# Patient Record
Sex: Male | Born: 1950
Health system: Southern US, Community
[De-identification: ages and names within clinical notes are randomized; demographics above are authoritative.]

## PROBLEM LIST (undated history)

## (undated) DIAGNOSIS — T7840XA Allergy, unspecified, initial encounter: Secondary | ICD-10-CM

## (undated) DIAGNOSIS — K259 Gastric ulcer, unspecified as acute or chronic, without hemorrhage or perforation: Secondary | ICD-10-CM

## (undated) DIAGNOSIS — E785 Hyperlipidemia, unspecified: Secondary | ICD-10-CM

## (undated) DIAGNOSIS — H269 Unspecified cataract: Secondary | ICD-10-CM

## (undated) DIAGNOSIS — K219 Gastro-esophageal reflux disease without esophagitis: Secondary | ICD-10-CM

## (undated) HISTORY — PX: HERNIA REPAIR: SHX51

## (undated) HISTORY — PX: CHOLECYSTECTOMY: SHX55

## (undated) HISTORY — DX: Gastro-esophageal reflux disease without esophagitis: K21.9

## (undated) HISTORY — PX: TONSILLECTOMY: SUR1361

## (undated) HISTORY — DX: Hyperlipidemia, unspecified: E78.5

## (undated) HISTORY — PX: UPPER GASTROINTESTINAL ENDOSCOPY: SHX188

## (undated) HISTORY — DX: Gastric ulcer, unspecified as acute or chronic, without hemorrhage or perforation: K25.9

## (undated) HISTORY — DX: Unspecified cataract: H26.9

## (undated) HISTORY — PX: COLONOSCOPY: SHX174

## (undated) HISTORY — DX: Allergy, unspecified, initial encounter: T78.40XA

---

## 2000-01-30 ENCOUNTER — Inpatient Hospital Stay (HOSPITAL_COMMUNITY): Admission: EM | Admit: 2000-01-30 | Discharge: 2000-02-01 | Payer: Self-pay | Admitting: Emergency Medicine

## 2000-01-30 ENCOUNTER — Encounter: Payer: Self-pay | Admitting: Surgery

## 2000-01-30 ENCOUNTER — Encounter: Payer: Self-pay | Admitting: Emergency Medicine

## 2004-03-23 ENCOUNTER — Ambulatory Visit (HOSPITAL_COMMUNITY): Admission: RE | Admit: 2004-03-23 | Discharge: 2004-03-23 | Payer: Self-pay | Admitting: Surgery

## 2005-03-24 ENCOUNTER — Ambulatory Visit: Payer: Self-pay | Admitting: Gastroenterology

## 2005-03-29 ENCOUNTER — Ambulatory Visit: Payer: Self-pay | Admitting: Gastroenterology

## 2013-07-12 ENCOUNTER — Inpatient Hospital Stay (HOSPITAL_COMMUNITY): Admission: RE | Admit: 2013-07-12 | Payer: Self-pay | Source: Ambulatory Visit

## 2013-07-25 ENCOUNTER — Other Ambulatory Visit (HOSPITAL_COMMUNITY): Payer: Self-pay | Admitting: Internal Medicine

## 2013-07-25 ENCOUNTER — Ambulatory Visit (HOSPITAL_COMMUNITY)
Admission: RE | Admit: 2013-07-25 | Discharge: 2013-07-25 | Disposition: A | Discharge status: Home / Self Care | Payer: BC Managed Care – PPO | Source: Ambulatory Visit | Attending: Vascular Surgery | Admitting: Vascular Surgery

## 2013-07-25 DIAGNOSIS — I714 Abdominal aortic aneurysm, without rupture, unspecified: Secondary | ICD-10-CM

## 2013-07-25 DIAGNOSIS — Z1389 Encounter for screening for other disorder: Secondary | ICD-10-CM | POA: Insufficient documentation

## 2013-07-30 ENCOUNTER — Ambulatory Visit (INDEPENDENT_AMBULATORY_CARE_PROVIDER_SITE_OTHER): Payer: BC Managed Care – PPO | Admitting: Cardiovascular Disease

## 2013-07-30 ENCOUNTER — Encounter: Payer: Self-pay | Admitting: Cardiovascular Disease

## 2013-07-30 VITALS — BP 150/80 | HR 70 | Resp 16 | Ht 67.0 in | Wt 187.5 lb

## 2013-07-30 DIAGNOSIS — R079 Chest pain, unspecified: Secondary | ICD-10-CM

## 2013-07-30 DIAGNOSIS — E663 Overweight: Secondary | ICD-10-CM

## 2013-07-30 DIAGNOSIS — R002 Palpitations: Secondary | ICD-10-CM

## 2013-07-30 NOTE — Patient Instructions (Signed)
Dr Sallyanne Kuster recommends you to follow-up as needed.

## 2013-08-05 ENCOUNTER — Encounter: Payer: Self-pay | Admitting: Cardiovascular Disease

## 2013-08-05 DIAGNOSIS — E663 Overweight: Secondary | ICD-10-CM | POA: Insufficient documentation

## 2013-08-05 DIAGNOSIS — R002 Palpitations: Secondary | ICD-10-CM | POA: Insufficient documentation

## 2013-08-05 NOTE — Progress Notes (Signed)
Patient ID: Todd Caldwell, male   DOB: 03/02/1951, 63 y.o.   MRN: [id number]      Reason for office visit Chest pain, palpitations, family history of aneurysms  Todd Caldwell is referred in consultation by Dr. Joylene Draft with complaints of chest discomfort and concerns regarding an abdominal aortic aneurysm. His father was a smoker and died from a ruptured aortic aneurysm at age 64. There is a less precise history of aneurysm in his paternal grandfather who died of a stroke.  Dr. Joylene Draft ordered an abdominal ultrasound which shows no evidence of abdominal aortic aneurysm.  The patient was having problems with some chest pain on the right side that sounds convincingly musculoskeletal and had symptoms of palpitations over the last 6-8 weeks. Interestingly his symptoms resolve completely after he decreased his daily aspirin 2 a weekly dose. He denies shortness of breath or chest pain with activity. He is overweight, borderline obese.  He was diagnos and try taking simvastatin but developed pain in his feet and numbness in his wrists. This resolved after stopping the statin. When he tried to take Crestor, the same symptoms occurred and he stopped this medication as well.ed with hyperlipidemia   Allergies  Allergen Reactions  . Statins     myalgias   . Chocolate Rash    Current Outpatient Prescriptions  Medication Sig Dispense Refill  . Acetaminophen (TYLENOL EXTRA STRENGTH) 167 MG/5ML LIQD 500 mg daily.      Marland Kitchen aspirin 81 MG tablet Take 81 mg by mouth once a week.      . cholecalciferol (VITAMIN D) 1000 UNITS tablet daily.      . Cyanocobalamin (B-12) 1000 MCG CAPS daily.      . Multiple Vitamins-Minerals (PX MENS MULTIVITAMINS) TABS daily.      . Omega 3 1000 MG CAPS 1,000 mg daily.      . ranitidine (ZANTAC) 150 MG capsule Take 150 mg by mouth daily.       No current facility-administered medications for this visit.    No past medical history on file.  No past surgical history on  file.  No family history on file.  History   Social History  . Marital Status: Married    Spouse Name: N/A    Number of Children: N/A  . Years of Education: N/A   Occupational History  . Not on file.   Social History Main Topics  . Smoking status: Never Smoker   . Smokeless tobacco: Not on file  . Alcohol Use: Yes     Comment: occas.  . Drug Use: No  . Sexual Activity: Not on file   Other Topics Concern  . Not on file   Social History Narrative  . No narrative on file    Review of systems: The patient specifically denies any chest pain at rest or with exertion, dyspnea at rest or with exertion, orthopnea, paroxysmal nocturnal dyspnea, syncope, palpitations, focal neurological deficits, intermittent claudication, lower extremity edema, unexplained weight gain, cough, hemoptysis or wheezing.  The patient also denies abdominal pain, nausea, vomiting, dysphagia, diarrhea, constipation, polyuria, polydipsia, dysuria, hematuria, frequency, urgency, abnormal bleeding or bruising, fever, chills, unexpected weight changes, mood swings, change in skin or hair texture, change in voice quality, auditory or visual problems, allergic reactions or rashes, new musculoskeletal complaints other than usual "aches and pains".   PHYSICAL EXAM BP 150/80  Pulse 70  Resp 16  Ht 5\' 7"  (1.702 m)  Wt 85.049 kg (187 lb 8 oz)  BMI  29.36 kg/m2  General: Alert, oriented x3, no distress Head: no evidence of trauma, PERRL, EOMI, no exophtalmos or lid lag, no myxedema, no xanthelasma; normal ears, nose and oropharynx Neck: normal jugular venous pulsations and no hepatojugular reflux; brisk carotid pulses without delay and no carotid bruits Chest: clear to auscultation, no signs of consolidation by percussion or palpation, normal fremitus, symmetrical and full respiratory excursions Cardiovascular: normal position and quality of the apical impulse, regular rhythm, normal first and second heart sounds,  no murmurs, rubs or gallops Abdomen: no tenderness or distention, no masses by palpation, no abnormal pulsatility or arterial bruits, normal bowel sounds, no hepatosplenomegaly Extremities: no clubbing, cyanosis or edema; 2+ radial, ulnar and brachial pulses bilaterally; 2+ right femoral, posterior tibial and dorsalis pedis pulses; 2+ left femoral, posterior tibial and dorsalis pedis pulses; no subclavian or femoral bruits Neurological: grossly nonfocal   EKG: NSR, questionable left atrial abnormality, no repolarization abnormalities  Lipid Panel results not available No results found for this basename: chol, trig, hdl, cholhdl, vldl, ldlcalc    BMET No results found for this basename: na, k, cl, co2, glucose, bun, creatinine, calcium, gfrnonaa, gfraa     ASSESSMENT AND PLAN  At this point Todd Caldwell is essentially asymptomatic at this time. It seems that his major concern was that he might have an abdominal aortic aneurysm which was not found by ultrasonography. We discussed the fact that abdominal aortic aneurysms are not necessarily familial and are strongly related to smoking. He does not want to start taking any medications for his cholesterol, because of the side effects. His blood pressure is borderline elevated today but is usually much better. He tells me was normal when he last Dr. Joylene Draft. At home when he checks it it is usually 120/70 mm Hg. He does not have diabetes and does not smoke. I don't think that he needs to take a daily aspirin.  We did talk about the importance of weight loss and regular physical activity. His current BMI is 29 and he should target weight loss of at least 15-20 pounds gradually over the next year. He will followup as needed or if there any cardiac issues not yet addressed.  Patient Instructions  Dr Sallyanne Kuster recommends you to follow-up as needed.    Orders Placed This Encounter  Procedures  . EKG 12-Lead   Meds ordered this encounter   Medications  . aspirin 81 MG tablet    Sig: Take 81 mg by mouth once a week.  . cholecalciferol (VITAMIN D) 1000 UNITS tablet    Sig: daily.  . ranitidine (ZANTAC) 150 MG capsule    Sig: Take 150 mg by mouth daily.  . Omega 3 1000 MG CAPS    Sig: 1,000 mg daily.  . Cyanocobalamin (B-12) 1000 MCG CAPS    Sig: daily.  . Multiple Vitamins-Minerals (PX MENS MULTIVITAMINS) TABS    Sig: daily.  . Acetaminophen (TYLENOL EXTRA STRENGTH) 167 MG/5ML LIQD    Sig: 500 mg daily.    Holli Humbles, MD, [place] [phone] office [phone] pager

## 2014-04-25 ENCOUNTER — Telehealth: Payer: Self-pay | Admitting: Internal Medicine

## 2014-04-25 NOTE — Telephone Encounter (Signed)
Pt called and would like to speak to Dr.Street concerning Todd Caldwell who is staying at [facility]. He is in agreement with Dr. Venetia Maxon on taking the patient off of her medications. Please call him at [phone]. jw

## 2014-04-29 NOTE — Telephone Encounter (Signed)
Returned phone call. Documented under Todd Caldwell' chart (11/20/1919) as she is the patient in this case, not Mr. Meditz, who is pt's HCPOA.

## 2015-04-07 ENCOUNTER — Encounter: Payer: Self-pay | Admitting: Gastroenterology

## 2015-05-10 DIAGNOSIS — Z23 Encounter for immunization: Secondary | ICD-10-CM | POA: Diagnosis not present

## 2015-08-19 DIAGNOSIS — D510 Vitamin B12 deficiency anemia due to intrinsic factor deficiency: Secondary | ICD-10-CM | POA: Diagnosis not present

## 2015-08-19 DIAGNOSIS — Z125 Encounter for screening for malignant neoplasm of prostate: Secondary | ICD-10-CM | POA: Diagnosis not present

## 2015-08-19 DIAGNOSIS — E784 Other hyperlipidemia: Secondary | ICD-10-CM | POA: Diagnosis not present

## 2015-08-19 DIAGNOSIS — R7301 Impaired fasting glucose: Secondary | ICD-10-CM | POA: Diagnosis not present

## 2015-08-25 DIAGNOSIS — R030 Elevated blood-pressure reading, without diagnosis of hypertension: Secondary | ICD-10-CM | POA: Diagnosis not present

## 2015-08-25 DIAGNOSIS — G5600 Carpal tunnel syndrome, unspecified upper limb: Secondary | ICD-10-CM | POA: Diagnosis not present

## 2015-08-25 DIAGNOSIS — I998 Other disorder of circulatory system: Secondary | ICD-10-CM | POA: Diagnosis not present

## 2015-08-25 DIAGNOSIS — R002 Palpitations: Secondary | ICD-10-CM | POA: Diagnosis not present

## 2015-08-25 DIAGNOSIS — I517 Cardiomegaly: Secondary | ICD-10-CM | POA: Diagnosis not present

## 2015-08-25 DIAGNOSIS — E784 Other hyperlipidemia: Secondary | ICD-10-CM | POA: Diagnosis not present

## 2015-08-25 DIAGNOSIS — Z0000 Encounter for general adult medical examination without abnormal findings: Secondary | ICD-10-CM | POA: Diagnosis not present

## 2015-08-25 DIAGNOSIS — Z6830 Body mass index (BMI) 30.0-30.9, adult: Secondary | ICD-10-CM | POA: Diagnosis not present

## 2015-08-25 DIAGNOSIS — H9193 Unspecified hearing loss, bilateral: Secondary | ICD-10-CM | POA: Diagnosis not present

## 2015-08-25 DIAGNOSIS — Z1389 Encounter for screening for other disorder: Secondary | ICD-10-CM | POA: Diagnosis not present

## 2015-08-25 DIAGNOSIS — D539 Nutritional anemia, unspecified: Secondary | ICD-10-CM | POA: Diagnosis not present

## 2015-08-25 DIAGNOSIS — M79671 Pain in right foot: Secondary | ICD-10-CM | POA: Diagnosis not present

## 2015-10-03 DIAGNOSIS — R05 Cough: Secondary | ICD-10-CM | POA: Diagnosis not present

## 2015-10-03 DIAGNOSIS — J302 Other seasonal allergic rhinitis: Secondary | ICD-10-CM | POA: Diagnosis not present

## 2015-11-14 DIAGNOSIS — J188 Other pneumonia, unspecified organism: Secondary | ICD-10-CM | POA: Diagnosis not present

## 2015-11-14 DIAGNOSIS — E784 Other hyperlipidemia: Secondary | ICD-10-CM | POA: Diagnosis not present

## 2016-04-16 DIAGNOSIS — Z23 Encounter for immunization: Secondary | ICD-10-CM | POA: Diagnosis not present

## 2016-04-29 DIAGNOSIS — I708 Atherosclerosis of other arteries: Secondary | ICD-10-CM | POA: Diagnosis not present

## 2016-04-29 DIAGNOSIS — H2513 Age-related nuclear cataract, bilateral: Secondary | ICD-10-CM | POA: Diagnosis not present

## 2016-04-29 DIAGNOSIS — H16223 Keratoconjunctivitis sicca, not specified as Sjogren's, bilateral: Secondary | ICD-10-CM | POA: Diagnosis not present

## 2016-05-14 DIAGNOSIS — H2511 Age-related nuclear cataract, right eye: Secondary | ICD-10-CM | POA: Diagnosis not present

## 2016-07-05 DIAGNOSIS — H2511 Age-related nuclear cataract, right eye: Secondary | ICD-10-CM | POA: Diagnosis not present

## 2016-07-06 DIAGNOSIS — H2512 Age-related nuclear cataract, left eye: Secondary | ICD-10-CM | POA: Diagnosis not present

## 2016-07-26 DIAGNOSIS — H2512 Age-related nuclear cataract, left eye: Secondary | ICD-10-CM | POA: Diagnosis not present

## 2016-08-23 DIAGNOSIS — D539 Nutritional anemia, unspecified: Secondary | ICD-10-CM | POA: Diagnosis not present

## 2016-08-23 DIAGNOSIS — E784 Other hyperlipidemia: Secondary | ICD-10-CM | POA: Diagnosis not present

## 2016-08-23 DIAGNOSIS — D538 Other specified nutritional anemias: Secondary | ICD-10-CM | POA: Diagnosis not present

## 2016-08-23 DIAGNOSIS — R7301 Impaired fasting glucose: Secondary | ICD-10-CM | POA: Diagnosis not present

## 2016-08-23 DIAGNOSIS — Z125 Encounter for screening for malignant neoplasm of prostate: Secondary | ICD-10-CM | POA: Diagnosis not present

## 2016-08-30 DIAGNOSIS — I517 Cardiomegaly: Secondary | ICD-10-CM | POA: Diagnosis not present

## 2016-08-30 DIAGNOSIS — R0609 Other forms of dyspnea: Secondary | ICD-10-CM | POA: Diagnosis not present

## 2016-08-30 DIAGNOSIS — R030 Elevated blood-pressure reading, without diagnosis of hypertension: Secondary | ICD-10-CM | POA: Diagnosis not present

## 2016-08-30 DIAGNOSIS — H9193 Unspecified hearing loss, bilateral: Secondary | ICD-10-CM | POA: Diagnosis not present

## 2016-08-30 DIAGNOSIS — E784 Other hyperlipidemia: Secondary | ICD-10-CM | POA: Diagnosis not present

## 2016-08-30 DIAGNOSIS — I998 Other disorder of circulatory system: Secondary | ICD-10-CM | POA: Diagnosis not present

## 2016-08-30 DIAGNOSIS — Z1389 Encounter for screening for other disorder: Secondary | ICD-10-CM | POA: Diagnosis not present

## 2016-08-30 DIAGNOSIS — Z6832 Body mass index (BMI) 32.0-32.9, adult: Secondary | ICD-10-CM | POA: Diagnosis not present

## 2016-08-30 DIAGNOSIS — Z0000 Encounter for general adult medical examination without abnormal findings: Secondary | ICD-10-CM | POA: Diagnosis not present

## 2016-08-30 DIAGNOSIS — E663 Overweight: Secondary | ICD-10-CM | POA: Diagnosis not present

## 2016-08-30 DIAGNOSIS — R600 Localized edema: Secondary | ICD-10-CM | POA: Diagnosis not present

## 2016-08-30 DIAGNOSIS — R7301 Impaired fasting glucose: Secondary | ICD-10-CM | POA: Diagnosis not present

## 2016-08-31 ENCOUNTER — Encounter (HOSPITAL_COMMUNITY): Payer: Self-pay

## 2016-09-03 ENCOUNTER — Other Ambulatory Visit (HOSPITAL_COMMUNITY): Payer: Self-pay | Admitting: Internal Medicine

## 2016-09-03 ENCOUNTER — Ambulatory Visit (HOSPITAL_COMMUNITY)
Admission: RE | Admit: 2016-09-03 | Discharge: 2016-09-03 | Disposition: A | Discharge status: Home / Self Care | Payer: Medicare Other | Source: Ambulatory Visit | Attending: Vascular Surgery | Admitting: Vascular Surgery

## 2016-09-03 DIAGNOSIS — Z1212 Encounter for screening for malignant neoplasm of rectum: Secondary | ICD-10-CM | POA: Diagnosis not present

## 2016-09-03 DIAGNOSIS — R609 Edema, unspecified: Secondary | ICD-10-CM

## 2016-09-03 DIAGNOSIS — I872 Venous insufficiency (chronic) (peripheral): Secondary | ICD-10-CM | POA: Insufficient documentation

## 2016-09-07 ENCOUNTER — Encounter: Payer: Self-pay | Admitting: Interventional Cardiology

## 2016-09-26 NOTE — Progress Notes (Signed)
Cardiology Office Note   Date:  09/27/2016   ID:  Todd Caldwell, DOB 29-Sep-1950, MRN [id number]  PCP:  Crist Infante, MD    No chief complaint on file. SHOB, hyperlipidemia   Wt Readings from Last 3 Encounters:  09/27/16 193 lb (87.5 kg)  07/30/13 187 lb 8 oz (85 kg)       History of Present Illness: Todd Caldwell is a 66 y.o. male who is being seen today for the evaluation of shortness of breath at the request of Crist Infante, MD.  He was taking heavy chairs upstairs, and noted some SHOB.  He had to rest a while. He has had high cholesterol. He has been intolerant of statins.  He reports joint swelling and pain in his knees and toes and fingers.  He was intolerant of atorvastatin, crestor and zetia. Knee swelling after the Zetia.    He denies pain , pressure or tightness with exertion.  He states he had a lower extremity Doppler that did not show any blood clot in his leg.    Past Medical History:  Diagnosis Date  . GERD (gastroesophageal reflux disease)     History reviewed. No pertinent surgical history.   Current Outpatient Prescriptions  Medication Sig Dispense Refill  . Acetaminophen (TYLENOL EXTRA STRENGTH) 167 MG/5ML LIQD 500 mg daily.    Marland Kitchen aspirin 81 MG tablet Take 81 mg by mouth once a week.    . cholecalciferol (VITAMIN D) 1000 UNITS tablet daily.    . Cyanocobalamin (B-12) 1000 MCG CAPS daily.    Marland Kitchen losartan (COZAAR) 25 MG tablet Take 25 mg by mouth daily.    . Multiple Vitamins-Minerals (PX MENS MULTIVITAMINS) TABS daily.    . Omega 3 1000 MG CAPS 1,000 mg daily.     No current facility-administered medications for this visit.     Allergies:   Statins and Chocolate    Social History:  The patient  reports that he has never smoked. He has never used smokeless tobacco. He reports that he drinks alcohol. He reports that he does not use drugs.   Family History:  The patient's family history includes AAA (abdominal aortic aneurysm) in his father;  Cancer in his mother; Diabetes Mellitus II in his brother. No early heart disease.   ROS:  Please see the history of present illness.   Otherwise, review of systems are positive for joint pains.   All other systems are reviewed and negative.    PHYSICAL EXAM: VS:  BP 136/80   Pulse 95   Ht 5\' 7"  (1.702 m)   Wt 193 lb (87.5 kg)   SpO2 95%   BMI 30.23 kg/m  , BMI Body mass index is 30.23 kg/m. GEN: Well nourished, well developed, in no acute distress  HEENT: normal  Neck: no JVD, carotid bruits, or masses Cardiac: RRR; no murmurs, rubs, or gallops,no edema  Respiratory:  clear to auscultation bilaterally, normal work of breathing GI: soft, nontender, nondistended, + BS MS: no deformity or atrophy  Skin: warm and dry, no rash; 2+ PT pulses bilaterally Neuro:  Strength and sensation are intact Psych: euthymic mood, full affect   EKG:   The ekg ordered today demonstrates NSR, ? Atrial enlargement, no ST segment changes   Recent Labs: No results found for requested labs within last 8760 hours.   Lipid Panel No results found for: CHOL, TRIG, HDL, CHOLHDL, VLDL, LDLCALC, LDLDIRECT   Other studies Reviewed: Additional studies/ records that were  reviewed today with results demonstrating: CXR 6/17: No active disease. LVH by echo in 2005 per PMD records.   ASSESSMENT AND PLAN:  1. DOE: Not occurring frequently. Occurred when he was walking up stairs carrying heavy theater chairs. We'll plan for exercise treadmill test to evaluate for ischemia. He does have some risk factors for CAD. 2. Hyperlipidemia: He has been intolerant of statins. We talked about lifestyle changes including healthy diet and regular exercise to help reduce his cholesterol. 3. Obesity: I told him to try to focus on getting his waist size down to 34 inches. Decreasing the abdominal obesity would lower his cardiovascular risk.   Current medicines are reviewed at length with the patient today.  The patient  concerns regarding his medicines were addressed.  The following changes have been made:  No change  Labs/ tests ordered today include: ETT No orders of the defined types were placed in this encounter.   Recommend 150 minutes/week of aerobic exercise Low fat, low carb, high fiber diet recommended  Disposition:   FU based on stress test result   Signed, Larae Grooms, MD  09/27/2016 9:15 AM    [facility] [place], [place], [place]  [address] Phone: [phone]; Fax: [phone]

## 2016-09-27 ENCOUNTER — Encounter: Payer: Self-pay | Admitting: Interventional Cardiology

## 2016-09-27 ENCOUNTER — Ambulatory Visit (INDEPENDENT_AMBULATORY_CARE_PROVIDER_SITE_OTHER): Payer: Medicare Other | Admitting: Interventional Cardiology

## 2016-09-27 VITALS — BP 136/80 | HR 95 | Ht 67.0 in | Wt 193.0 lb

## 2016-09-27 DIAGNOSIS — R0602 Shortness of breath: Secondary | ICD-10-CM

## 2016-09-27 DIAGNOSIS — E782 Mixed hyperlipidemia: Secondary | ICD-10-CM | POA: Diagnosis not present

## 2016-09-27 DIAGNOSIS — E785 Hyperlipidemia, unspecified: Secondary | ICD-10-CM | POA: Insufficient documentation

## 2016-09-27 DIAGNOSIS — E669 Obesity, unspecified: Secondary | ICD-10-CM | POA: Diagnosis not present

## 2016-09-27 NOTE — Patient Instructions (Signed)
Medication Instructions:  None  Labwork: None  Testing/Procedures: Your physician has requested that you have an exercise tolerance test. For further information please visit [email or web address]. Please also follow instruction sheet, as given.   Follow-Up: Your physician recommends that you schedule a follow-up appointment will be based on test outcome.     Any Other Special Instructions Will Be Listed Below (If Applicable).     If you need a refill on your cardiac medications before your next appointment, please call your pharmacy.

## 2016-10-08 ENCOUNTER — Ambulatory Visit (INDEPENDENT_AMBULATORY_CARE_PROVIDER_SITE_OTHER): Payer: Medicare Other

## 2016-10-08 DIAGNOSIS — R0602 Shortness of breath: Secondary | ICD-10-CM | POA: Diagnosis not present

## 2016-10-08 LAB — EXERCISE TOLERANCE TEST
CSEPEW: 7 METS
CSEPHR: 98 %
Exercise duration (min): 6 min
Exercise duration (sec): 0 s
MPHR: 153 {beats}/min
Peak HR: 150 {beats}/min
RPE: 17
Rest HR: 74 {beats}/min

## 2016-10-27 DIAGNOSIS — Z6831 Body mass index (BMI) 31.0-31.9, adult: Secondary | ICD-10-CM | POA: Diagnosis not present

## 2016-10-27 DIAGNOSIS — R600 Localized edema: Secondary | ICD-10-CM | POA: Diagnosis not present

## 2016-11-05 DIAGNOSIS — E784 Other hyperlipidemia: Secondary | ICD-10-CM | POA: Diagnosis not present

## 2017-01-20 DIAGNOSIS — Z1211 Encounter for screening for malignant neoplasm of colon: Secondary | ICD-10-CM | POA: Diagnosis not present

## 2017-01-20 DIAGNOSIS — Z1212 Encounter for screening for malignant neoplasm of rectum: Secondary | ICD-10-CM | POA: Diagnosis not present

## 2017-03-13 DIAGNOSIS — Z23 Encounter for immunization: Secondary | ICD-10-CM | POA: Diagnosis not present

## 2017-03-20 ENCOUNTER — Other Ambulatory Visit: Payer: Self-pay

## 2017-03-20 ENCOUNTER — Inpatient Hospital Stay (HOSPITAL_COMMUNITY)
Admission: EM | Admit: 2017-03-20 | Discharge: 2017-03-22 | DRG: 378 | Disposition: A | Discharge status: Home / Self Care | Payer: Medicare Other | Attending: Internal Medicine | Admitting: Internal Medicine

## 2017-03-20 ENCOUNTER — Emergency Department (HOSPITAL_COMMUNITY): Payer: Medicare Other

## 2017-03-20 ENCOUNTER — Encounter (HOSPITAL_COMMUNITY): Payer: Self-pay

## 2017-03-20 DIAGNOSIS — K5900 Constipation, unspecified: Secondary | ICD-10-CM | POA: Diagnosis present

## 2017-03-20 DIAGNOSIS — R195 Other fecal abnormalities: Secondary | ICD-10-CM | POA: Diagnosis not present

## 2017-03-20 DIAGNOSIS — K921 Melena: Secondary | ICD-10-CM

## 2017-03-20 DIAGNOSIS — Z79899 Other long term (current) drug therapy: Secondary | ICD-10-CM

## 2017-03-20 DIAGNOSIS — E669 Obesity, unspecified: Secondary | ICD-10-CM | POA: Diagnosis not present

## 2017-03-20 DIAGNOSIS — E860 Dehydration: Secondary | ICD-10-CM | POA: Diagnosis not present

## 2017-03-20 DIAGNOSIS — R7989 Other specified abnormal findings of blood chemistry: Secondary | ICD-10-CM | POA: Diagnosis present

## 2017-03-20 DIAGNOSIS — D62 Acute posthemorrhagic anemia: Secondary | ICD-10-CM | POA: Diagnosis present

## 2017-03-20 DIAGNOSIS — N179 Acute kidney failure, unspecified: Secondary | ICD-10-CM | POA: Diagnosis not present

## 2017-03-20 DIAGNOSIS — I10 Essential (primary) hypertension: Secondary | ICD-10-CM | POA: Diagnosis present

## 2017-03-20 DIAGNOSIS — Z6830 Body mass index (BMI) 30.0-30.9, adult: Secondary | ICD-10-CM

## 2017-03-20 DIAGNOSIS — R739 Hyperglycemia, unspecified: Secondary | ICD-10-CM | POA: Diagnosis present

## 2017-03-20 DIAGNOSIS — R55 Syncope and collapse: Secondary | ICD-10-CM | POA: Diagnosis not present

## 2017-03-20 DIAGNOSIS — K922 Gastrointestinal hemorrhage, unspecified: Secondary | ICD-10-CM | POA: Diagnosis not present

## 2017-03-20 DIAGNOSIS — Z7982 Long term (current) use of aspirin: Secondary | ICD-10-CM

## 2017-03-20 DIAGNOSIS — K219 Gastro-esophageal reflux disease without esophagitis: Secondary | ICD-10-CM | POA: Diagnosis present

## 2017-03-20 DIAGNOSIS — R109 Unspecified abdominal pain: Secondary | ICD-10-CM | POA: Diagnosis not present

## 2017-03-20 DIAGNOSIS — E785 Hyperlipidemia, unspecified: Secondary | ICD-10-CM | POA: Diagnosis present

## 2017-03-20 DIAGNOSIS — K254 Chronic or unspecified gastric ulcer with hemorrhage: Secondary | ICD-10-CM | POA: Diagnosis not present

## 2017-03-20 LAB — CBC WITH DIFFERENTIAL/PLATELET
BASOS ABS: 0 10*3/uL (ref 0.0–0.1)
Basophils Relative: 0 %
Eosinophils Absolute: 0 10*3/uL (ref 0.0–0.7)
Eosinophils Relative: 0 %
HEMATOCRIT: 39.1 % (ref 39.0–52.0)
Hemoglobin: 13.2 g/dL (ref 13.0–17.0)
LYMPHS PCT: 20 %
Lymphs Abs: 2.6 10*3/uL (ref 0.7–4.0)
MCH: 32.6 pg (ref 26.0–34.0)
MCHC: 33.8 g/dL (ref 30.0–36.0)
MCV: 96.5 fL (ref 78.0–100.0)
Monocytes Absolute: 0.8 10*3/uL (ref 0.1–1.0)
Monocytes Relative: 6 %
NEUTROS ABS: 9.7 10*3/uL — AB (ref 1.7–7.7)
Neutrophils Relative %: 74 %
Platelets: 226 10*3/uL (ref 150–400)
RBC: 4.05 MIL/uL — AB (ref 4.22–5.81)
RDW: 12.3 % (ref 11.5–15.5)
WBC: 13.1 10*3/uL — ABNORMAL HIGH (ref 4.0–10.5)

## 2017-03-20 LAB — HEMOGLOBIN A1C
HEMOGLOBIN A1C: 5.5 % (ref 4.8–5.6)
MEAN PLASMA GLUCOSE: 111.15 mg/dL

## 2017-03-20 LAB — I-STAT CHEM 8, ED
BUN: 50 mg/dL — AB (ref 6–20)
CALCIUM ION: 1.03 mmol/L — AB (ref 1.15–1.40)
Chloride: 101 mmol/L (ref 101–111)
Creatinine, Ser: 1.2 mg/dL (ref 0.61–1.24)
Glucose, Bld: 178 mg/dL — ABNORMAL HIGH (ref 65–99)
HCT: 38 % — ABNORMAL LOW (ref 39.0–52.0)
HEMOGLOBIN: 12.9 g/dL — AB (ref 13.0–17.0)
Potassium: 4.2 mmol/L (ref 3.5–5.1)
Sodium: 136 mmol/L (ref 135–145)
TCO2: 24 mmol/L (ref 22–32)

## 2017-03-20 LAB — URINALYSIS, ROUTINE W REFLEX MICROSCOPIC
BILIRUBIN URINE: NEGATIVE
Glucose, UA: NEGATIVE mg/dL
HGB URINE DIPSTICK: NEGATIVE
KETONES UR: 5 mg/dL — AB
Leukocytes, UA: NEGATIVE
Nitrite: NEGATIVE
PH: 6 (ref 5.0–8.0)
PROTEIN: NEGATIVE mg/dL
SPECIFIC GRAVITY, URINE: 1.031 — AB (ref 1.005–1.030)

## 2017-03-20 LAB — I-STAT CG4 LACTIC ACID, ED
Lactic Acid, Venous: 3.03 mmol/L (ref 0.5–1.9)
Lactic Acid, Venous: 1.03 mmol/L (ref 0.5–1.9)

## 2017-03-20 LAB — TROPONIN I: Troponin I: <0.03 ng/mL (ref <0.03)

## 2017-03-20 LAB — GLUCOSE, CAPILLARY
GLUCOSE-CAPILLARY: 124 mg/dL — AB (ref 65–99)
Glucose-Capillary: 102 mg/dL — ABNORMAL HIGH (ref 65–99)

## 2017-03-20 LAB — COMPREHENSIVE METABOLIC PANEL
ALBUMIN: 3.3 g/dL — AB (ref 3.5–5.0)
ALT: 29 U/L (ref 17–63)
ANION GAP: 10 (ref 5–15)
AST: 27 U/L (ref 15–41)
Alkaline Phosphatase: 68 U/L (ref 38–126)
BILIRUBIN TOTAL: 1 mg/dL (ref 0.3–1.2)
BUN: 55 mg/dL — ABNORMAL HIGH (ref 6–20)
CO2: 23 mmol/L (ref 22–32)
Calcium: 8.5 mg/dL — ABNORMAL LOW (ref 8.9–10.3)
Chloride: 103 mmol/L (ref 101–111)
Creatinine, Ser: 1.31 mg/dL — ABNORMAL HIGH (ref 0.61–1.24)
GFR calc Af Amer: >60 mL/min (ref >60)
GFR calc non Af Amer: 55 mL/min — ABNORMAL LOW (ref >60)
GLUCOSE: 181 mg/dL — AB (ref 65–99)
POTASSIUM: 4.3 mmol/L (ref 3.5–5.1)
SODIUM: 136 mmol/L (ref 135–145)
TOTAL PROTEIN: 5.3 g/dL — AB (ref 6.5–8.1)

## 2017-03-20 LAB — TSH: TSH: 0.559 u[IU]/mL (ref 0.350–4.500)

## 2017-03-20 LAB — I-STAT TROPONIN, ED: Troponin i, poc: 0.01 ng/mL (ref 0.00–0.08)

## 2017-03-20 LAB — TYPE AND SCREEN
ABO/RH(D): A POS
ANTIBODY SCREEN: NEGATIVE

## 2017-03-20 LAB — CBC
HCT: 31.8 % — ABNORMAL LOW (ref 39.0–52.0)
Hemoglobin: 10.7 g/dL — ABNORMAL LOW (ref 13.0–17.0)
MCH: 32.4 pg (ref 26.0–34.0)
MCHC: 33.6 g/dL (ref 30.0–36.0)
MCV: 96.4 fL (ref 78.0–100.0)
PLATELETS: 224 10*3/uL (ref 150–400)
RBC: 3.3 MIL/uL — ABNORMAL LOW (ref 4.22–5.81)
RDW: 12.1 % (ref 11.5–15.5)
WBC: 11.4 10*3/uL — ABNORMAL HIGH (ref 4.0–10.5)

## 2017-03-20 LAB — POC OCCULT BLOOD, ED: FECAL OCCULT BLD: POSITIVE — AB

## 2017-03-20 LAB — ABO/RH: ABO/RH(D): A POS

## 2017-03-20 LAB — LIPASE, BLOOD: Lipase: 33 U/L (ref 11–51)

## 2017-03-20 MED ORDER — SODIUM CHLORIDE 0.9 % IV BOLUS (SEPSIS)
1000.0000 mL | Freq: Once | INTRAVENOUS | Status: AC
  Administered 2017-03-20: 1000 mL via INTRAVENOUS

## 2017-03-20 MED ORDER — BISACODYL 10 MG RE SUPP: 10.0000 mg | Freq: Every day | RECTAL | Status: DC | PRN

## 2017-03-20 MED ORDER — ENOXAPARIN SODIUM 40 MG/0.4ML ~~LOC~~ SOLN: 40.0000 mg | SUBCUTANEOUS | Status: DC

## 2017-03-20 MED ORDER — INSULIN ASPART 100 UNIT/ML ~~LOC~~ SOLN: 0.0000 [IU] | Freq: Every day | SUBCUTANEOUS | Status: DC

## 2017-03-20 MED ORDER — MAGNESIUM HYDROXIDE 400 MG/5ML PO SUSP
30.0000 mL | Freq: Every day | ORAL | Status: DC
  Filled 2017-03-20: qty 30

## 2017-03-20 MED ORDER — MORPHINE SULFATE (PF) 4 MG/ML IV SOLN: 1.0000 mg | INTRAVENOUS | Status: DC | PRN

## 2017-03-20 MED ORDER — PANTOPRAZOLE SODIUM 40 MG IV SOLR
80.0000 mg | Freq: Once | INTRAVENOUS | Status: AC
  Administered 2017-03-20: 15:00:00 80 mg via INTRAVENOUS
  Filled 2017-03-20: qty 80

## 2017-03-20 MED ORDER — HYDROCODONE-ACETAMINOPHEN 5-325 MG PO TABS: 1.0000 | ORAL_TABLET | ORAL | Status: DC | PRN

## 2017-03-20 MED ORDER — ONDANSETRON HCL 4 MG/2ML IJ SOLN: 4.0000 mg | Freq: Four times a day (QID) | INTRAMUSCULAR | Status: DC | PRN

## 2017-03-20 MED ORDER — INSULIN ASPART 100 UNIT/ML ~~LOC~~ SOLN
0.0000 [IU] | Freq: Three times a day (TID) | SUBCUTANEOUS | Status: DC
  Administered 2017-03-20: 1 [IU] via SUBCUTANEOUS

## 2017-03-20 MED ORDER — SODIUM CHLORIDE 0.9 % IV SOLN
INTRAVENOUS | Status: DC
  Administered 2017-03-20: 15:00:00 via INTRAVENOUS

## 2017-03-20 MED ORDER — LACTULOSE 10 GM/15ML PO SOLN
30.0000 g | Freq: Two times a day (BID) | ORAL | Status: DC
  Administered 2017-03-21: 30 g via ORAL
  Filled 2017-03-20: qty 45

## 2017-03-20 MED ORDER — ACETAMINOPHEN 650 MG RE SUPP: 650.0000 mg | Freq: Four times a day (QID) | RECTAL | Status: DC | PRN

## 2017-03-20 MED ORDER — DOCUSATE SODIUM 100 MG PO CAPS
100.0000 mg | ORAL_CAPSULE | Freq: Two times a day (BID) | ORAL | Status: DC
  Administered 2017-03-20 – 2017-03-21 (×2): 100 mg via ORAL
  Filled 2017-03-20 (×2): qty 1

## 2017-03-20 MED ORDER — ONDANSETRON HCL 4 MG PO TABS: 4.0000 mg | ORAL_TABLET | Freq: Four times a day (QID) | ORAL | Status: DC | PRN

## 2017-03-20 MED ORDER — IOPAMIDOL (ISOVUE-370) INJECTION 76%
INTRAVENOUS | Status: AC
  Administered 2017-03-20: 100 mL
  Filled 2017-03-20: qty 100

## 2017-03-20 MED ORDER — ACETAMINOPHEN 325 MG PO TABS: 650.0000 mg | ORAL_TABLET | Freq: Four times a day (QID) | ORAL | Status: DC | PRN

## 2017-03-20 MED ORDER — SODIUM CHLORIDE 0.9% FLUSH
3.0000 mL | Freq: Two times a day (BID) | INTRAVENOUS | Status: DC
  Administered 2017-03-21: 3 mL via INTRAVENOUS

## 2017-03-20 NOTE — ED Notes (Signed)
Attempted report 

## 2017-03-20 NOTE — ED Provider Notes (Signed)
[facility] Provider Note   CSN: [id number] Arrival date & time: 03/20/17  7793     History   Chief Complaint Chief Complaint  Patient presents with  . Loss of Consciousness    HPI Todd Caldwell is a 66 y.o. male.  HPI  66 year old male with a history of hyperlipidemia, reflux presents with concern for syncopal episodes.  Patient reports syncope initially this morning after he had had a bowel movement.  Reports that the bowel movement was normal formed stool, however black in color.  Reports he then had another syncopal episode as he went from sitting to standing and walk to the bathroom to urinate, and had a third syncopal episode while he was in triage in the emergency department.  Reports he felt lightheaded prior to all these episodes.  Denies chest pain or shortness of breath.  Denies numbness or weakness on one side or the other.  Does report he has had abdominal discomfort over the last 3 weeks, feeling of fullness.  Reports nausea without vomiting.  Reports abdominal distention.  Denies use of ibuprofen, regular alcohol use.  Denies any history of GI bleed or ulcers.  Denies diarrhea.  Past Medical History:  Diagnosis Date  . GERD (gastroesophageal reflux disease)     Patient Active Problem List   Diagnosis Date Noted  . Syncope 03/20/2017  . Elevated lactic acid level 03/20/2017  . Acute kidney injury ([facility]) 03/20/2017  . Hyperglycemia 03/20/2017  . Constipation 03/20/2017  . Obesity (BMI 30-39.9) 09/27/2016  . Hyperlipidemia 09/27/2016  . Overweight(278.02) 08/05/2013  . Palpitations 08/05/2013    History reviewed. No pertinent surgical history.     Home Medications    Prior to Admission medications   Medication Sig Start Date End Date Taking? Authorizing Provider  aspirin 81 MG tablet Take 81 mg by mouth once a week. Sunday of each week   Yes [provider]  chlorthalidone (HYGROTON) 25 MG tablet Take 12.5 mg by mouth  daily.   Yes [provider]  cholecalciferol (VITAMIN D) 1000 UNITS tablet daily. 09/08/09  Yes [provider]  Cyanocobalamin (B-12) 1000 MCG CAPS daily. 09/08/09  Yes [provider]  ezetimibe (ZETIA) 10 MG tablet Take 10 mg by mouth daily.   Yes [provider]  losartan (COZAAR) 25 MG tablet Take 25 mg by mouth daily. 07/26/16  Yes [provider]  Multiple Vitamins-Minerals (PX MENS MULTIVITAMINS) TABS daily. 09/08/09  Yes [provider]  Omega 3 1000 MG CAPS 1,000 mg daily. 06/26/12  Yes [provider]  pseudoephedrine-acetaminophen (TYLENOL SINUS) 30-500 MG TABS tablet Take 1 tablet by mouth every 4 (four) hours as needed.   Yes [provider]  Acetaminophen (TYLENOL EXTRA STRENGTH) 167 MG/5ML LIQD 500 mg daily. 01/12/10   [provider]    Family History Family History  Problem Relation Age of Onset  . Cancer Mother   . AAA (abdominal aortic aneurysm) Father   . Diabetes Mellitus II Brother     Social History Social History  Substance Use Topics  . Smoking status: Never Smoker  . Smokeless tobacco: Never Used  . Alcohol use Yes     Comment: occas.     Allergies   Statins and Chocolate   Review of Systems Review of Systems  Constitutional: Positive for fatigue. Negative for fever.  HENT: Negative for sore throat.   Eyes: Negative for visual disturbance.  Respiratory: Negative for shortness of breath.  Cardiovascular: Negative for chest pain and palpitations.  Gastrointestinal: Positive for abdominal distention and abdominal pain (discomfort). Negative for constipation, nausea and vomiting. Blood in stool: possible, black stool, formed.  Genitourinary: Negative for difficulty urinating.  Musculoskeletal: Negative for back pain and neck stiffness.  Skin: Negative for rash.  Neurological: Positive for syncope and light-headedness. Negative for headaches.     Physical Exam Updated  Vital Signs BP 122/70 (BP Location: Left Arm)   Pulse 100   Temp 98.1 F (36.7 C) (Oral)   Resp 18   Ht 5\' 7"  (1.702 m)   Wt 87.5 kg (193 lb)   SpO2 100%   BMI 30.23 kg/m   Physical Exam  Constitutional: He is oriented to person, place, and time. He appears well-developed and well-nourished. No distress.  HENT:  Head: Normocephalic and atraumatic.  Eyes: Conjunctivae and EOM are normal.  Neck: Normal range of motion.  Cardiovascular: Normal rate, regular rhythm, normal heart sounds and intact distal pulses.  Exam reveals no gallop and no friction rub.   No murmur heard. Pulmonary/Chest: Effort normal and breath sounds normal. No respiratory distress. He has no wheezes. He has no rales.  Abdominal: Soft. He exhibits distension. There is tenderness (epigastric). There is no guarding.  Genitourinary: Rectal exam shows guaiac positive stool.  Genitourinary Comments: Dark brown-black/dark green stool on exam  Musculoskeletal: He exhibits no edema.  Neurological: He is alert and oriented to person, place, and time.  Skin: Skin is warm and dry. He is not diaphoretic.  Nursing note and vitals reviewed.    ED Treatments / Results  Labs (all labs ordered are listed, but only abnormal results are displayed) Labs Reviewed  URINALYSIS, ROUTINE W REFLEX MICROSCOPIC - Abnormal; Notable for the following:       Result Value   Color, Urine STRAW (*)    Specific Gravity, Urine 1.031 (*)    Ketones, ur 5 (*)    All other components within normal limits  COMPREHENSIVE METABOLIC PANEL - Abnormal; Notable for the following:    Glucose, Bld 181 (*)    BUN 55 (*)    Creatinine, Ser 1.31 (*)    Calcium 8.5 (*)    Total Protein 5.3 (*)    Albumin 3.3 (*)    GFR calc non Af Amer 55 (*)    All other components within normal limits  CBC WITH DIFFERENTIAL/PLATELET - Abnormal; Notable for the following:    WBC 13.1 (*)    RBC 4.05 (*)    Neutro Abs 9.7 (*)    All other components within normal  limits  GLUCOSE, CAPILLARY - Abnormal; Notable for the following:    Glucose-Capillary 124 (*)    All other components within normal limits  I-STAT CHEM 8, ED - Abnormal; Notable for the following:    BUN 50 (*)    Glucose, Bld 178 (*)    Calcium, Ion 1.03 (*)    Hemoglobin 12.9 (*)    HCT 38.0 (*)    All other components within normal limits  I-STAT CG4 LACTIC ACID, ED - Abnormal; Notable for the following:    Lactic Acid, Venous 3.03 (*)    All other components within normal limits  POC OCCULT BLOOD, ED - Abnormal; Notable for the following:    Fecal Occult Bld POSITIVE (*)    All other components within normal limits  URINE CULTURE  LIPASE, BLOOD  HEMOGLOBIN A1C  TSH  CBC  BASIC METABOLIC PANEL  CBC  TROPONIN  I  TROPONIN I  I-STAT TROPONIN, ED  I-STAT CG4 LACTIC ACID, ED  TYPE AND SCREEN  ABO/RH    EKG  EKG Interpretation  Date/Time:  Sunday March 20 2017 10:03:37 EDT Ventricular Rate:  101 PR Interval:  124 QRS Duration: 84 QT Interval:  338 QTC Calculation: 438 R Axis:   60 Text Interpretation:  Sinus tachycardia Possible Left atrial enlargement Borderline ECG Since prior ECG, rate has increased Confirmed by Gareth Morgan [phone]) on 03/20/2017 10:15:39 AM       Radiology Ct Angio Abd/pel W And/or Wo Contrast  Result Date: 03/20/2017 CLINICAL DATA:  fulness in his stomach and unable to eat a lot. Soreness in the right side mid abd, problem with passing out and low blood pressure . He has no energy and a general feeling of not being his normal self. Today he had a very dark stool. EXAM: CTA ABDOMEN AND PELVIS WITH CONTRAST TECHNIQUE: Multidetector CT imaging of the abdomen and pelvis was performed using the standard protocol during bolus administration of intravenous contrast. Multiplanar reconstructed images and MIPs were obtained and reviewed to evaluate the vascular anatomy. CONTRAST:  100 mL Isovue 370 IV COMPARISON:  None. FINDINGS: VASCULAR Aorta: Mild  scattered atheromatous plaque. No aneurysm, dissection, or stenosis. Celiac: Patent without evidence of aneurysm, dissection, vasculitis or significant stenosis. SMA: Patent without evidence of aneurysm, dissection, vasculitis or significant stenosis. Renals: Single right, duplicated left. Patent without evidence of aneurysm, dissection, vasculitis, fibromuscular dysplasia or significant stenosis. IMA: Patent without evidence of aneurysm, dissection, vasculitis or significant stenosis. Inflow: Patent without evidence of aneurysm, dissection, vasculitis or significant stenosis. Minimal scattered calcified plaque. Proximal Outflow: Bilateral common femoral and visualized portions of the superficial and profunda femoral arteries are patent without evidence of aneurysm, dissection, vasculitis or significant stenosis. Veins: No obvious venous abnormality within the limitations of this arterial phase study. Review of the MIP images confirms the above findings. NON-VASCULAR Lower chest: No acute abnormality. Hepatobiliary: No focal liver abnormality is seen. Status post cholecystectomy. No biliary dilatation. Pancreas: Unremarkable. No pancreatic ductal dilatation or surrounding inflammatory changes. Spleen: Normal in size without focal abnormality. Adrenals/Urinary Tract: Adrenal glands are unremarkable. Kidneys are normal, without renal calculi, focal lesion, or hydronephrosis. Bladder is unremarkable. Stomach/Bowel: Stomach is within normal limits. Appendix appears normal. No evidence of bowel wall thickening, distention, or inflammatory changes. Lymphatic: No abdominal or pelvic adenopathy. Reproductive: Mild prostatic enlargement. Other: No ascites.  No free air. Musculoskeletal: Mild spondylitic changes in the lumbar spine. Negative for fracture or or worrisome bone lesion. Clips from laparoscopic bilateral inguinal hernia repair. IMPRESSION: VASCULAR 1. Minimal aortoiliac atheromatous plaque, without acute finding.  2. No significant proximal mesenteric arterial occlusive disease to suggest any etiology for occlusive mesenteric ischemia. NON-VASCULAR 1. No acute findings. 2. Mild  lumbar spondylitic change. Electronically Signed   By: Lucrezia Europe M.D.   On: 03/20/2017 12:51    Procedures Procedures (including critical care time)  Medications Ordered in ED Medications  sodium chloride flush (NS) 0.9 % injection 3 mL (3 mLs Intravenous Not Given 03/20/17 1517)  insulin aspart (novoLOG) injection 0-9 Units (1 Units Subcutaneous Given 03/20/17 1729)  insulin aspart (novoLOG) injection 0-5 Units (not administered)  0.9 %  sodium chloride infusion ( Intravenous Transfusing/Transfer 03/20/17 1557)  acetaminophen (TYLENOL) tablet 650 mg (not administered)    Or  acetaminophen (TYLENOL) suppository 650 mg (not administered)  HYDROcodone-acetaminophen (NORCO/VICODIN) 5-325 MG per tablet 1-2 tablet (not administered)  morphine 4 MG/ML injection 1  mg (not administered)  docusate sodium (COLACE) capsule 100 mg (100 mg Oral Given 03/20/17 1700)  bisacodyl (DULCOLAX) suppository 10 mg (not administered)  ondansetron (ZOFRAN) tablet 4 mg (not administered)    Or  ondansetron (ZOFRAN) injection 4 mg (not administered)  magnesium hydroxide (MILK OF MAGNESIA) suspension 30 mL (30 mLs Oral Not Given 03/20/17 1728)  lactulose (CHRONULAC) 10 GM/15ML solution 30 g (not administered)  sodium chloride 0.9 % bolus 1,000 mL (0 mLs Intravenous Stopped 03/20/17 1309)  sodium chloride 0.9 % bolus 1,000 mL (0 mLs Intravenous Stopped 03/20/17 1309)  iopamidol (ISOVUE-370) 76 % injection (100 mLs  Contrast Given 03/20/17 1152)  pantoprazole (PROTONIX) 80 mg in sodium chloride 0.9 % 100 mL IVPB (0 mg Intravenous Stopped 03/20/17 1557)     Initial Impression / Assessment and Plan / ED Course  I have reviewed the triage vital signs and the nursing notes.  Pertinent labs & imaging results that were available during my care of the  patient were reviewed by me and considered in my medical decision making (see chart for details).     66 year old male with a history of hyperlipidemia, reflux presents with concern for syncopal episodes.  Patient with syncopal episode also in triage, and on arrival to the emergency department was noted to be tachycardic with blood pressures in the 16B systolic initially.  Lactic acid elevated to 3.  CT angios abdomen and pelvis was done to evaluate for signs of mesenteric ischemia, AAA, obstruction and showed no acute abnormalities.  Denies chest pain or shortness of breath in the low suspicion for pulmonary embolus or ACS.  No neurologic symptoms.  Reports 1 formed black stool, not typical history for upper GI bleed, however the patient has elevated BUN, and soft dark stool which is Hemoccult positive on my exam.  He has not had any other episodes of dark stool, no hematemesis.  Ordered a bolus of Protonix, type and screen has already been completed, and notified hospitalist service.  Given patient's atypical history at this time, hemodynamic stability after initial vital signs, normal hemoglobin, I do not feel emergent gastroenterology consult is indicated.    Other etiologies of his elevated lactic acid, creatinine and BUN include dehydration in the setting of patient not eating or drinking as much of an history of abdominal discomfort. This may be etiology of syncope with dehydration and vagal component. Given multiple syncopal episodes today, will admit to the hospital service for continued syncope observation, and continued monitoring for possible upper GI bleed.  Final Clinical Impressions(s) / ED Diagnoses   Final diagnoses:  Syncope, unspecified syncope type  Occult blood positive stool  Gastrointestinal hemorrhage with melena    New Prescriptions Current Discharge Medication List       Gareth Morgan, MD 03/20/17 1900

## 2017-03-20 NOTE — ED Triage Notes (Signed)
Patient arrived into triage pale and diaphoretic with abdominal swelling, states that he has passed out x 2 this am and reports minimal bowel movement this am. While talking had syncopal event with extreme diaphoresis and dry heaves. Now alert but pale

## 2017-03-20 NOTE — H&P (Signed)
History and Physical    Todd Caldwell PYP:[id number] DOB: 03-Jan-1951 DOA: 03/20/2017  PCP: Crist Infante, MD Patient coming from: home  Chief Complaint: syncope  HPI: Todd Caldwell is a very pleasant 66 y.o. male with medical history significant for hyperglycemia, hyperlipidemia, obesity, palpitations emergency department chief complaint 2 episodes of syncope.  Relation concerning for dehydration orthostatic hypotension vasovagal response in the setting of an elevated lactic acid.  [facility] are asked to admit  Information is obtained from the patient and his wife is at the bedside.  He states he was in his usual state of health until this morning he awakened state he had to go to the restroom.  She was ambulating to the restroom he felt a little dizzy and nauseous.  He sat down on the toilet members leaning forward as he did not feel well and then his next memory is waking up on the floor.  He states he felt like he had to have a bowel movement but he had not strained to do so.  He got up went back to bed and slept for another couple hours.  He awakened to go the bathroom to empty his bladder. He syncopized again.  He denies headache dizziness visual disturbances numbness tingling of extremities.  He denies difficulty chewing or swallowing.  Denies chest pain palpitation shortness of breath.  He does endorse some nausea with dry heaves.  He also reports some abdominal distention and mild discomfort in the right upper quadrant.  Did have a bowel movement that was normal in color and consistency.  He denies dark stool bright red blood per rectum.  He denies dysuria hematuria frequency or urgency.  He denies lower extremity edema orthopnea recent travel or sick contacts.    ED Course: In the emergency department he is afebrile hemodynamically stable mildly tachycardic not hypoxic.  He is provided with 2 L of normal saline tachycardia improved  Review of Systems: As per HPI otherwise all  other systems reviewed and are negative.   Ambulatory Status: Ambulates independently is independent with ADLs  Past Medical History:  Diagnosis Date  . GERD (gastroesophageal reflux disease)     History reviewed. No pertinent surgical history.  Social History   Social History  . Marital status: Married    Spouse name: N/A  . Number of children: N/A  . Years of education: N/A   Occupational History  . Not on file.   Social History Main Topics  . Smoking status: Never Smoker  . Smokeless tobacco: Never Used  . Alcohol use Yes     Comment: occas.  . Drug use: No  . Sexual activity: Not on file   Other Topics Concern  . Not on file   Social History Narrative  . No narrative on file    Allergies  Allergen Reactions  . Statins     myalgias   . Chocolate Rash    Family History  Problem Relation Age of Onset  . Cancer Mother   . AAA (abdominal aortic aneurysm) Father   . Diabetes Mellitus II Brother     Prior to Admission medications   Medication Sig Start Date End Date Taking? Authorizing Provider  aspirin 81 MG tablet Take 81 mg by mouth once a week. Sunday of each week   Yes [provider]  chlorthalidone (HYGROTON) 25 MG tablet Take 12.5 mg by mouth daily.   Yes [provider]  cholecalciferol (VITAMIN D) 1000 UNITS tablet daily. 09/08/09  Yes [provider]  Cyanocobalamin (B-12) 1000 MCG CAPS daily. 09/08/09  Yes [provider]  ezetimibe (ZETIA) 10 MG tablet Take 10 mg by mouth daily.   Yes [provider]  losartan (COZAAR) 25 MG tablet Take 25 mg by mouth daily. 07/26/16  Yes [provider]  Multiple Vitamins-Minerals (PX MENS MULTIVITAMINS) TABS daily. 09/08/09  Yes [provider]  Omega 3 1000 MG CAPS 1,000 mg daily. 06/26/12  Yes [provider]  pseudoephedrine-acetaminophen (TYLENOL SINUS) 30-500 MG TABS tablet Take 1 tablet by mouth every 4 (four) hours as needed.   Yes  [provider]  Acetaminophen (TYLENOL EXTRA STRENGTH) 167 MG/5ML LIQD 500 mg daily. 01/12/10   [provider]    Physical Exam: Vitals:   03/20/17 1230 03/20/17 1300 03/20/17 1330 03/20/17 1400  BP: 130/83 126/63 132/82 129/78  Pulse: 98 100 98 (!) 102  Resp: 18  (!) 22 19  Temp:      TempSrc:      SpO2: 96% 96% 96% 96%  Weight:      Height:         General:  Appears calm and comfortable no acute distress Eyes:  PERRL, EOMI, normal lids, iris ENT:  grossly normal hearing, lips & tongue, his membranes of his mouth are moist and pink Neck:  no LAD, masses or thyromegaly Cardiovascular:  RRR, no m/r/g. No LE edema.  Pedal pulses present and palpable Respiratory:  CTA bilaterally, no w/r/r. Normal respiratory effort. Abdomen:  soft, slightly distended sluggish bowel sounds diffuse tenderness particularly in the upper quadrants no guarding or rebounding Skin:  no rash or induration seen on limited exam, bruising on upper extremities Musculoskeletal:  grossly normal tone BUE/BLE, good ROM, no bony abnormality Psychiatric:  grossly normal mood and affect, speech fluent and appropriate, AOx3 Neurologic:  CN 2-12 grossly intact, moves all extremities in coordinated fashion, sensation intact speech clear facial symmetry tongue midline  Labs on Admission: I have personally reviewed following labs and imaging studies  CBC:  Recent Labs Lab 03/20/17 1012 03/20/17 1034  WBC 13.1*  --   NEUTROABS 9.7*  --   HGB 13.2 12.9*  HCT 39.1 38.0*  MCV 96.5  --   PLT 226  --    Basic Metabolic Panel:  Recent Labs Lab 03/20/17 1012 03/20/17 1034  NA 136 136  K 4.3 4.2  CL 103 101  CO2 23  --   GLUCOSE 181* 178*  BUN 55* 50*  CREATININE 1.31* 1.20  CALCIUM 8.5*  --    GFR: Estimated Creatinine Clearance: 62.6 mL/min (by C-G formula based on SCr of 1.2 mg/dL). Liver Function Tests:  Recent Labs Lab 03/20/17 1012  AST 27  ALT 29  ALKPHOS 68  BILITOT 1.0    PROT 5.3*  ALBUMIN 3.3*    Recent Labs Lab 03/20/17 1012  LIPASE 33   No results for input(s): AMMONIA in the last 168 hours. Coagulation Profile: No results for input(s): INR, PROTIME in the last 168 hours. Cardiac Enzymes: No results for input(s): CKTOTAL, CKMB, CKMBINDEX, TROPONINI in the last 168 hours. BNP (last 3 results) No results for input(s): PROBNP in the last 8760 hours. HbA1C: No results for input(s): HGBA1C in the last 72 hours. CBG: No results for input(s): GLUCAP in the last 168 hours. Lipid Profile: No results for input(s): CHOL, HDL, LDLCALC, TRIG, CHOLHDL, LDLDIRECT in the last 72 hours. Thyroid Function Tests: No results for input(s): TSH, T4TOTAL, FREET4, T3FREE, THYROIDAB  in the last 72 hours. Anemia Panel: No results for input(s): VITAMINB12, FOLATE, FERRITIN, TIBC, IRON, RETICCTPCT in the last 72 hours. Urine analysis:    Component Value Date/Time   COLORURINE STRAW (A) 03/20/2017 1309   APPEARANCEUR CLEAR 03/20/2017 1309   LABSPEC 1.031 (H) 03/20/2017 1309   PHURINE 6.0 03/20/2017 1309   GLUCOSEU NEGATIVE 03/20/2017 1309   HGBUR NEGATIVE 03/20/2017 1309   BILIRUBINUR NEGATIVE 03/20/2017 1309   KETONESUR 5 (A) 03/20/2017 1309   PROTEINUR NEGATIVE 03/20/2017 1309   NITRITE NEGATIVE 03/20/2017 1309   LEUKOCYTESUR NEGATIVE 03/20/2017 1309    Creatinine Clearance: Estimated Creatinine Clearance: 62.6 mL/min (by C-G formula based on SCr of 1.2 mg/dL).  Sepsis Labs: @LABRCNTIP (procalcitonin:4,lacticidven:4) )No results found for this or any previous visit (from the past 240 hour(s)).   Radiological Exams on Admission: Ct Angio Abd/pel W And/or Wo Contrast  Result Date: 03/20/2017 CLINICAL DATA:  fulness in his stomach and unable to eat a lot. Soreness in the right side mid abd, problem with passing out and low blood pressure . He has no energy and a general feeling of not being his normal self. Today he had a very dark stool. EXAM: CTA ABDOMEN  AND PELVIS WITH CONTRAST TECHNIQUE: Multidetector CT imaging of the abdomen and pelvis was performed using the standard protocol during bolus administration of intravenous contrast. Multiplanar reconstructed images and MIPs were obtained and reviewed to evaluate the vascular anatomy. CONTRAST:  100 mL Isovue 370 IV COMPARISON:  None. FINDINGS: VASCULAR Aorta: Mild scattered atheromatous plaque. No aneurysm, dissection, or stenosis. Celiac: Patent without evidence of aneurysm, dissection, vasculitis or significant stenosis. SMA: Patent without evidence of aneurysm, dissection, vasculitis or significant stenosis. Renals: Single right, duplicated left. Patent without evidence of aneurysm, dissection, vasculitis, fibromuscular dysplasia or significant stenosis. IMA: Patent without evidence of aneurysm, dissection, vasculitis or significant stenosis. Inflow: Patent without evidence of aneurysm, dissection, vasculitis or significant stenosis. Minimal scattered calcified plaque. Proximal Outflow: Bilateral common femoral and visualized portions of the superficial and profunda femoral arteries are patent without evidence of aneurysm, dissection, vasculitis or significant stenosis. Veins: No obvious venous abnormality within the limitations of this arterial phase study. Review of the MIP images confirms the above findings. NON-VASCULAR Lower chest: No acute abnormality. Hepatobiliary: No focal liver abnormality is seen. Status post cholecystectomy. No biliary dilatation. Pancreas: Unremarkable. No pancreatic ductal dilatation or surrounding inflammatory changes. Spleen: Normal in size without focal abnormality. Adrenals/Urinary Tract: Adrenal glands are unremarkable. Kidneys are normal, without renal calculi, focal lesion, or hydronephrosis. Bladder is unremarkable. Stomach/Bowel: Stomach is within normal limits. Appendix appears normal. No evidence of bowel wall thickening, distention, or inflammatory changes. Lymphatic: No  abdominal or pelvic adenopathy. Reproductive: Mild prostatic enlargement. Other: No ascites.  No free air. Musculoskeletal: Mild spondylitic changes in the lumbar spine. Negative for fracture or or worrisome bone lesion. Clips from laparoscopic bilateral inguinal hernia repair. IMPRESSION: VASCULAR 1. Minimal aortoiliac atheromatous plaque, without acute finding. 2. No significant proximal mesenteric arterial occlusive disease to suggest any etiology for occlusive mesenteric ischemia. NON-VASCULAR 1. No acute findings. 2. Mild  lumbar spondylitic change. Electronically Signed   By: Lucrezia Europe M.D.   On: 03/20/2017 12:51    EKG: Independently reviewed. Sinus tachycardia Possible Left atrial enlargement Borderline ECG  Assessment/Plan Principal Problem:   Syncope Active Problems:   Obesity (BMI 30-39.9)   Hyperlipidemia   Elevated lactic acid level   Acute kidney injury ([place])   Hyperglycemia   #1.  Syncope.  Presentation  favors vasovagal response in the setting of mild dehydration related to diuresis decreased oral intake and constipation. Has an elevated lactic acid mild leukocytosis acute kidney injury.  Is afebrile hemodynamically stable and not hypoxic.  Initial troponin negative EKG as noted above.  He received 2 L normal saline lactic acid trending down.  Urinalysis unremarkable CT of the chest/abdomen without infiltrate no, no free air no bowel thickening. Some concern for gi bleed as FOBT +. Hg 13.1 -Admit to telemetry -Continue IV fluids -Obtain orthostatic vital signs -2D echo -Hold hygroton -serial cbc -bowel regimen  2.  Hyperglycemia.  Serum glucose 178 on admission.  No history of diabetes. -Obtain hemoglobin A1c -Sliding scale insulin for optimal control -Will need outpatient follow-up  #3.  Acute kidney injury.  Creatinine 1.31 on admission.  Likely related to decreased oral intake -Gentle IV fluids -Hold nephrotoxins -Monitor urine output -Recheck in the morning  #4.   Elevated lactic acid.  Related to dehydration.  Lactic acid 3.03 upon presentation.  At admission after fluid resuscitation lactic acid 1.03.  He remains afebrile hemodynamically stable nontoxic-appearing -Continue fluids  5.constipation. Patient admits to minimal BM's of late -scheduled colace -mom    DVT prophylaxis: scd  Code Status: full  Family Communication: wife at bedside  Disposition Plan: home  Consults called: none  Admission status: obs    Radene Gunning MD [facility]  If 7PM-7AM, please contact night-coverage [email or web address] Password [email or web address]  03/20/2017, 2:23 PM

## 2017-03-20 NOTE — Progress Notes (Signed)
Patient admitted to [facility] from Emergency Room, VSS.  No complaints of pain.  Patient up and walked to the bathroom, did have a dark tarry stool.  Felt light headed after bowel movement and heading back to bed.  Wife at bedside.

## 2017-03-20 NOTE — Progress Notes (Signed)
Patient with dark tarry stool.

## 2017-03-21 ENCOUNTER — Observation Stay (HOSPITAL_COMMUNITY): Payer: Medicare Other

## 2017-03-21 ENCOUNTER — Observation Stay (HOSPITAL_BASED_OUTPATIENT_CLINIC_OR_DEPARTMENT_OTHER): Payer: Medicare Other

## 2017-03-21 DIAGNOSIS — R55 Syncope and collapse: Secondary | ICD-10-CM | POA: Diagnosis not present

## 2017-03-21 DIAGNOSIS — Z79899 Other long term (current) drug therapy: Secondary | ICD-10-CM | POA: Diagnosis not present

## 2017-03-21 DIAGNOSIS — K922 Gastrointestinal hemorrhage, unspecified: Secondary | ICD-10-CM | POA: Diagnosis not present

## 2017-03-21 DIAGNOSIS — D62 Acute posthemorrhagic anemia: Secondary | ICD-10-CM

## 2017-03-21 DIAGNOSIS — K921 Melena: Secondary | ICD-10-CM | POA: Diagnosis not present

## 2017-03-21 DIAGNOSIS — Z6830 Body mass index (BMI) 30.0-30.9, adult: Secondary | ICD-10-CM | POA: Diagnosis not present

## 2017-03-21 DIAGNOSIS — K5900 Constipation, unspecified: Secondary | ICD-10-CM | POA: Diagnosis present

## 2017-03-21 DIAGNOSIS — R7989 Other specified abnormal findings of blood chemistry: Secondary | ICD-10-CM | POA: Diagnosis not present

## 2017-03-21 DIAGNOSIS — N179 Acute kidney failure, unspecified: Secondary | ICD-10-CM | POA: Diagnosis not present

## 2017-03-21 DIAGNOSIS — E860 Dehydration: Secondary | ICD-10-CM | POA: Diagnosis present

## 2017-03-21 DIAGNOSIS — E669 Obesity, unspecified: Secondary | ICD-10-CM | POA: Diagnosis present

## 2017-03-21 DIAGNOSIS — K219 Gastro-esophageal reflux disease without esophagitis: Secondary | ICD-10-CM | POA: Diagnosis present

## 2017-03-21 DIAGNOSIS — R739 Hyperglycemia, unspecified: Secondary | ICD-10-CM | POA: Diagnosis present

## 2017-03-21 DIAGNOSIS — K254 Chronic or unspecified gastric ulcer with hemorrhage: Secondary | ICD-10-CM | POA: Diagnosis not present

## 2017-03-21 DIAGNOSIS — Z7982 Long term (current) use of aspirin: Secondary | ICD-10-CM | POA: Diagnosis not present

## 2017-03-21 DIAGNOSIS — I10 Essential (primary) hypertension: Secondary | ICD-10-CM | POA: Diagnosis present

## 2017-03-21 DIAGNOSIS — K3189 Other diseases of stomach and duodenum: Secondary | ICD-10-CM | POA: Diagnosis not present

## 2017-03-21 DIAGNOSIS — E785 Hyperlipidemia, unspecified: Secondary | ICD-10-CM | POA: Diagnosis present

## 2017-03-21 LAB — ECHOCARDIOGRAM COMPLETE
AOASC: 33 cm
CHL CUP MV DEC (S): 257
CHL CUP RV SYS PRESS: 24 mmHg
E decel time: 257 msec
E/e' ratio: 6.3
FS: 36 % (ref 28–44)
Height: 67 in
IVS/LV PW RATIO, ED: 0.85
LA diam end sys: 30 mm
LA vol A4C: 27.9 ml
LA vol: 30.5 mL
LADIAMINDEX: 1.52 cm/m2
LASIZE: 30 mm
LAVOLIN: 15.4 mL/m2
LV E/e' medial: 6.3
LV PW d: 12 mm — AB (ref 0.6–1.1)
LV SIMPSON'S DISK: 57
LV TDI E'MEDIAL: 4.9
LV dias vol index: 26 mL/m2
LV dias vol: 52 mL — AB (ref 62–150)
LV sys vol index: 11 mL/m2
LV sys vol: 22 mL (ref 21–61)
LVEEAVG: 6.3
LVELAT: 7.94 cm/s
LVOT SV: 65 mL
LVOT VTI: 18.7 cm
LVOT area: 3.46 cm2
LVOT diameter: 21 mm
LVOT peak grad rest: 6 mmHg
LVOTPV: 120 cm/s
Lateral S' vel: 16.5 cm/s
MV pk A vel: 94.6 m/s
MVPKEVEL: 50 m/s
RV TAPSE: 23.8 mm
Reg peak vel: 230 cm/s
Stroke v: 30 ml
TDI e' lateral: 7.94
TRMAXVEL: 230 cm/s
Weight: 3065.6 oz

## 2017-03-21 LAB — GLUCOSE, CAPILLARY
GLUCOSE-CAPILLARY: 108 mg/dL — AB (ref 65–99)
GLUCOSE-CAPILLARY: 103 mg/dL — AB (ref 65–99)
GLUCOSE-CAPILLARY: 116 mg/dL — AB (ref 65–99)
Glucose-Capillary: 104 mg/dL — ABNORMAL HIGH (ref 65–99)
Glucose-Capillary: 82 mg/dL (ref 65–99)

## 2017-03-21 LAB — CBC
HCT: 30.4 % — ABNORMAL LOW (ref 39.0–52.0)
HEMOGLOBIN: 10.3 g/dL — AB (ref 13.0–17.0)
MCH: 32.7 pg (ref 26.0–34.0)
MCHC: 33.9 g/dL (ref 30.0–36.0)
MCV: 96.5 fL (ref 78.0–100.0)
Platelets: 220 10*3/uL (ref 150–400)
RBC: 3.15 MIL/uL — AB (ref 4.22–5.81)
RDW: 12.4 % (ref 11.5–15.5)
WBC: 10.3 10*3/uL (ref 4.0–10.5)

## 2017-03-21 LAB — BASIC METABOLIC PANEL
Anion gap: 6 (ref 5–15)
BUN: 37 mg/dL — AB (ref 6–20)
CALCIUM: 7.5 mg/dL — AB (ref 8.9–10.3)
CO2: 27 mmol/L (ref 22–32)
CREATININE: 1.16 mg/dL (ref 0.61–1.24)
Chloride: 104 mmol/L (ref 101–111)
GFR calc non Af Amer: >60 mL/min (ref >60)
Glucose, Bld: 81 mg/dL (ref 65–99)
Potassium: 3.8 mmol/L (ref 3.5–5.1)
Sodium: 137 mmol/L (ref 135–145)

## 2017-03-21 LAB — URINE CULTURE: Culture: NO GROWTH

## 2017-03-21 LAB — TROPONIN I: Troponin I: <0.03 ng/mL (ref <0.03)

## 2017-03-21 LAB — VITAMIN B12: Vitamin B-12: 736 pg/mL (ref 180–914)

## 2017-03-21 LAB — HEMOGLOBIN AND HEMATOCRIT, BLOOD
HCT: 30.8 % — ABNORMAL LOW (ref 39.0–52.0)
Hemoglobin: 10.4 g/dL — ABNORMAL LOW (ref 13.0–17.0)

## 2017-03-21 MED ORDER — PANTOPRAZOLE SODIUM 40 MG IV SOLR: 40.0000 mg | Freq: Two times a day (BID) | INTRAVENOUS | Status: DC

## 2017-03-21 MED ORDER — SODIUM CHLORIDE 0.9 % IV SOLN
8.0000 mg/h | INTRAVENOUS | Status: DC
  Administered 2017-03-21 – 2017-03-22 (×2): 8 mg/h via INTRAVENOUS
  Filled 2017-03-21 (×4): qty 80

## 2017-03-21 MED ORDER — SODIUM CHLORIDE 0.9 % IV SOLN
1.0000 g | Freq: Once | INTRAVENOUS | Status: AC
  Administered 2017-03-21: 1 g via INTRAVENOUS
  Filled 2017-03-21: qty 10

## 2017-03-21 NOTE — Progress Notes (Signed)
PROGRESS NOTE    Todd Caldwell  WNU:[id number] DOB: March 17, 1951 DOA: 03/20/2017 PCP: Crist Infante, MD   Outpatient Specialists:     Brief Narrative:  Todd Caldwell is a very pleasant 66 y.o. male with medical history significant for hyperglycemia, hyperlipidemia, obesity, palpitations emergency department chief complaint 2 episodes of syncope.  Relation concerning for dehydration orthostatic hypotension vasovagal response in the setting of an elevated lactic acid.  [facility] are asked to admit  Information is obtained from the patient and his wife is at the bedside.  He states he was in his usual state of health until this morning he awakened state he had to go to the restroom.  She was ambulating to the restroom he felt a little dizzy and nauseous.  He sat down on the toilet members leaning forward as he did not feel well and then his next memory is waking up on the floor.  He states he felt like he had to have a bowel movement but he had not strained to do so.  He got up went back to bed and slept for another couple hours.  He awakened to go the bathroom to empty his bladder. He syncopized again.  He denies headache dizziness visual disturbances numbness tingling of extremities.  He denies difficulty chewing or swallowing.  Denies chest pain palpitation shortness of breath.  He does endorse some nausea with dry heaves.  He also reports some abdominal distention and mild discomfort in the right upper quadrant.  Did have a bowel movement that was normal in color and consistency.  He denies dark stool bright red blood per rectum.  He denies dysuria hematuria frequency or urgency.  He denies lower extremity edema orthopnea recent travel or sick contacts.     Assessment & Plan:   Principal Problem:   Syncope Active Problems:   Obesity (BMI 30-39.9)   Hyperlipidemia   Elevated lactic acid level   Acute kidney injury ([place])   Hyperglycemia   Constipation   Syncope.   -received  2 L normal saline lactic acid trending down -Urinalysis unremarkable  -CT of the chest/abdomen without infiltrate  -FOBT + - orthostatic vital signs negative -2D echo:- Left ventricle: The cavity size was normal. Systolic function was   vigorous. The estimated ejection fraction was in the range of 65%   to 70%. Wall motion was normal; there were no regional wall   motion abnormalities. Doppler parameters are consistent with   abnormal left ventricular relaxation (grade 1 diastolic   dysfunction). -Hold hygroton  Dark tarry bowel movements -Hgb dropped 3 grams -GI consult Clear diet for today -protonix gtt  Hyperglycemia.   -Serum glucose 178 on admission -No history of diabetes. -hemoglobin A1c: 5.5  Acute kidney injury.  Creatinine 1.31 on admission.  Likely related to decreased oral intake -Gentle IV fluids -Hold nephrotoxins -Monitor urine output -Recheck in the morning  Elevated lactic acid.  Related to dehydration.  Lactic acid 3.03 upon presentation.  At admission after fluid resuscitation lactic acid 1.03.  He remains afebrile hemodynamically stable nontoxic-appearing  hypocalemia -replete IV    DVT prophylaxis:  SCD's  Code Status: Full Code   Family Communication:   Disposition Plan:     Consultants:   GI  Procedures:       Subjective: C/o 3rd toe pain on right foot-- happens at night  Objective: Vitals:   03/21/17 0203 03/21/17 0621 03/21/17 0624 03/21/17 0800  BP: 109/64   106/75  Pulse: Marland Kitchen)  104   99  Resp:    18  Temp: 98.1 F (36.7 C) 98.2 F (36.8 C)  98.2 F (36.8 C)  TempSrc: Oral Oral  Oral  SpO2: 98%   96%  Weight:   86.9 kg (191 lb 9.6 oz)   Height:        Intake/Output Summary (Last 24 hours) at 03/21/17 1240 Last data filed at 03/21/17 0956  Gross per 24 hour  Intake          3409.67 ml  Output              820 ml  Net          2589.67 ml   Filed Weights   03/20/17 0956 03/20/17 1612 03/21/17 0624  Weight:  86.2 kg (190 lb) 87.5 kg (193 lb) 86.9 kg (191 lb 9.6 oz)    Examination:  General exam: Appears calm and comfortable  Respiratory system: Clear to auscultation. Respiratory effort normal. Cardiovascular system: S1 & S2 heard, RRR. No JVD, murmurs, rubs, gallops or clicks. No pedal edema. Gastrointestinal system: mild tenderness in upper abd Central nervous system: Alert and oriented. No focal neurological deficits. Extremities: Symmetric 5 x 5 power. Skin: pulse intact, no discoloration on right foot Psychiatry: Judgement and insight appear normal. Mood & affect appropriate.     Data Reviewed: I have personally reviewed following labs and imaging studies  CBC:  Recent Labs Lab 03/20/17 1012 03/20/17 1034 03/20/17 1833 03/21/17 0215  WBC 13.1*  --  11.4* 10.3  NEUTROABS 9.7*  --   --   --   HGB 13.2 12.9* 10.7* 10.3*  HCT 39.1 38.0* 31.8* 30.4*  MCV 96.5  --  96.4 96.5  PLT 226  --  224 132   Basic Metabolic Panel:  Recent Labs Lab 03/20/17 1012 03/20/17 1034 03/21/17 0215  NA 136 136 137  K 4.3 4.2 3.8  CL 103 101 104  CO2 23  --  27  GLUCOSE 181* 178* 81  BUN 55* 50* 37*  CREATININE 1.31* 1.20 1.16  CALCIUM 8.5*  --  7.5*   GFR: Estimated Creatinine Clearance: 65 mL/min (by C-G formula based on SCr of 1.16 mg/dL). Liver Function Tests:  Recent Labs Lab 03/20/17 1012  AST 27  ALT 29  ALKPHOS 68  BILITOT 1.0  PROT 5.3*  ALBUMIN 3.3*    Recent Labs Lab 03/20/17 1012  LIPASE 33   No results for input(s): AMMONIA in the last 168 hours. Coagulation Profile: No results for input(s): INR, PROTIME in the last 168 hours. Cardiac Enzymes:  Recent Labs Lab 03/20/17 2015 03/21/17 0215  TROPONINI <0.03 <0.03   BNP (last 3 results) No results for input(s): PROBNP in the last 8760 hours. HbA1C:  Recent Labs  03/20/17 1833  HGBA1C 5.5   CBG:  Recent Labs Lab 03/20/17 1638 03/20/17 2135 03/21/17 0630 03/21/17 0752 03/21/17 1158  GLUCAP  124* 102* 108* 104* 103*   Lipid Profile: No results for input(s): CHOL, HDL, LDLCALC, TRIG, CHOLHDL, LDLDIRECT in the last 72 hours. Thyroid Function Tests:  Recent Labs  03/20/17 1833  TSH 0.559   Anemia Panel: No results for input(s): VITAMINB12, FOLATE, FERRITIN, TIBC, IRON, RETICCTPCT in the last 72 hours. Urine analysis:    Component Value Date/Time   COLORURINE STRAW (A) 03/20/2017 1309   APPEARANCEUR CLEAR 03/20/2017 1309   LABSPEC 1.031 (H) 03/20/2017 1309   PHURINE 6.0 03/20/2017 1309   GLUCOSEU NEGATIVE 03/20/2017 1309   HGBUR  NEGATIVE 03/20/2017 1309   BILIRUBINUR NEGATIVE 03/20/2017 1309   KETONESUR 5 (A) 03/20/2017 1309   PROTEINUR NEGATIVE 03/20/2017 1309   NITRITE NEGATIVE 03/20/2017 1309   LEUKOCYTESUR NEGATIVE 03/20/2017 1309     ) Recent Results (from the past 240 hour(s))  Urine culture     Status: None   Collection Time: 03/20/17  1:09 PM  Result Value Ref Range Status   Specimen Description URINE, RANDOM  Final   Special Requests NONE  Final   Culture NO GROWTH  Final   Report Status 03/21/2017 FINAL  Final      Anti-infectives    None       Radiology Studies: Ct Angio Abd/pel W And/or Wo Contrast  Result Date: 03/20/2017 CLINICAL DATA:  fulness in his stomach and unable to eat a lot. Soreness in the right side mid abd, problem with passing out and low blood pressure . He has no energy and a general feeling of not being his normal self. Today he had a very dark stool. EXAM: CTA ABDOMEN AND PELVIS WITH CONTRAST TECHNIQUE: Multidetector CT imaging of the abdomen and pelvis was performed using the standard protocol during bolus administration of intravenous contrast. Multiplanar reconstructed images and MIPs were obtained and reviewed to evaluate the vascular anatomy. CONTRAST:  100 mL Isovue 370 IV COMPARISON:  None. FINDINGS: VASCULAR Aorta: Mild scattered atheromatous plaque. No aneurysm, dissection, or stenosis. Celiac: Patent without  evidence of aneurysm, dissection, vasculitis or significant stenosis. SMA: Patent without evidence of aneurysm, dissection, vasculitis or significant stenosis. Renals: Single right, duplicated left. Patent without evidence of aneurysm, dissection, vasculitis, fibromuscular dysplasia or significant stenosis. IMA: Patent without evidence of aneurysm, dissection, vasculitis or significant stenosis. Inflow: Patent without evidence of aneurysm, dissection, vasculitis or significant stenosis. Minimal scattered calcified plaque. Proximal Outflow: Bilateral common femoral and visualized portions of the superficial and profunda femoral arteries are patent without evidence of aneurysm, dissection, vasculitis or significant stenosis. Veins: No obvious venous abnormality within the limitations of this arterial phase study. Review of the MIP images confirms the above findings. NON-VASCULAR Lower chest: No acute abnormality. Hepatobiliary: No focal liver abnormality is seen. Status post cholecystectomy. No biliary dilatation. Pancreas: Unremarkable. No pancreatic ductal dilatation or surrounding inflammatory changes. Spleen: Normal in size without focal abnormality. Adrenals/Urinary Tract: Adrenal glands are unremarkable. Kidneys are normal, without renal calculi, focal lesion, or hydronephrosis. Bladder is unremarkable. Stomach/Bowel: Stomach is within normal limits. Appendix appears normal. No evidence of bowel wall thickening, distention, or inflammatory changes. Lymphatic: No abdominal or pelvic adenopathy. Reproductive: Mild prostatic enlargement. Other: No ascites.  No free air. Musculoskeletal: Mild spondylitic changes in the lumbar spine. Negative for fracture or or worrisome bone lesion. Clips from laparoscopic bilateral inguinal hernia repair. IMPRESSION: VASCULAR 1. Minimal aortoiliac atheromatous plaque, without acute finding. 2. No significant proximal mesenteric arterial occlusive disease to suggest any etiology for  occlusive mesenteric ischemia. NON-VASCULAR 1. No acute findings. 2. Mild  lumbar spondylitic change. Electronically Signed   By: Lucrezia Europe M.D.   On: 03/20/2017 12:51        Scheduled Meds: . docusate sodium  100 mg Oral BID  . insulin aspart  0-5 Units Subcutaneous QHS  . insulin aspart  0-9 Units Subcutaneous TID WC  . lactulose  30 g Oral BID  . magnesium hydroxide  30 mL Oral Daily  . [START ON 03/24/2017] pantoprazole  40 mg Intravenous Q12H  . sodium chloride flush  3 mL Intravenous Q12H   Continuous Infusions: .  sodium chloride 50 mL/hr at 03/20/17 1516  . pantoprozole (PROTONIX) infusion 8 mg/hr (03/21/17 1115)     LOS: 0 days    Time spent: 48 min    [place], DO [facility] Pager [phone]  If 7PM-7AM, please contact night-coverage [email or web address] Password [email or web address] 03/21/2017, 12:40 PM

## 2017-03-21 NOTE — Progress Notes (Signed)
  Echocardiogram 2D Echocardiogram has been performed.  Todd Caldwell 03/21/2017, 10:39 AM

## 2017-03-21 NOTE — Consult Note (Signed)
[facility] Consult: 11:01 AM 03/21/2017  LOS: 0 days    Referring Provider: Dr Princella Ion  Primary Care Physician:  Crist Infante, MD Primary Gastroenterologist:  Dr.  Fuller Plan Wife Myra Anastos [phone]    Reason for Consultation:  Tarry stool.     HPI: Todd Caldwell is a 66 y.o. male.  Takes 81 mg ASA daily.  Htn.  B12 deficiency.   2006 Colonoscopy, avg risk screening.  Normal study.  Re screen colonoscopy in 03/2015 Early 2018 Cologuard study.  Submitted specimen and never heard back from Dr. Haynes Kerns, assumes it was unremarkable. Patient has never undergone upper endoscopy.  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  Last week he was experiencing some sense of abdominal bloating, fullness.  He felt hungry but when he ate, he did not need to eat as much to feel full.  Did not have any nausea or vomiting or change in the appearance of his daily, brown stools.  He also noticed that when he would lean forward, putting pressure on his stomach, he would feel a little bit dizzy but this would resolve when he stood up.  This had not previously been an issue Yesterday, 03/20/17, at 4 AM he got up and sat on the commode to have a bowel movement.  He had a syncopal spell but woke up quickly and when he passed a stool it was black mixed with brown in color.  He went back to bed.  At 7:30 AM he got up to urinate and on the way to the bathroom he was syncopal.  He called his wife, who was at her mother's where she is the caregiver, to come get him.  He proceeded to the ED where he had a third dark stool at 9:30 in the morning.  Patient takes an 81 mg aspirin once a week.  He does not take NSAIDs other than maybe once every several weeks.  He does not drink alcohol.  He has no prior history of any liver problems.   He is never required blood transfusions or had GI bleeding he tends to bruise easily.  Previously took ranitidine for GER but has not needed to use this in a long time, several years. In the ED initial blood pressure 98/66 with pulse of 116.  Continues to have intermittent tachycardia in the low 100s and blood pressure in the low 100s over 60s-70s.  Last bowel movement was soft and black, this morning.  Unfortunately he ate a breakfast of solid food today. Hgb 10.7 >> 10.3.  No prior Hgb to compare.  MCV 96.  Platelets normal.  BUN 55 >> 37.  Lactic acid 1 >> 1.0.  FOBT +.   CT angio: minor aortiliac plaque, no mesenteric vascular disease.    Past Medical History:  Diagnosis Date  . GERD (gastroesophageal reflux disease)     History reviewed. No pertinent surgical history.  Prior to Admission medications   Medication Sig Start Date End Date Taking? Authorizing Provider  aspirin 81 MG tablet Take 81 mg  by mouth once a week. Sunday of each week   Yes [provider]  chlorthalidone (HYGROTON) 25 MG tablet Take 12.5 mg by mouth daily.   Yes [provider]  cholecalciferol (VITAMIN D) 1000 UNITS tablet daily. 09/08/09  Yes [provider]  Cyanocobalamin (B-12) 1000 MCG CAPS daily. 09/08/09  Yes [provider]  ezetimibe (ZETIA) 10 MG tablet Take 10 mg by mouth daily.   Yes [provider]  losartan (COZAAR) 25 MG tablet Take 25 mg by mouth daily. 07/26/16  Yes [provider]  Multiple Vitamins-Minerals (PX MENS MULTIVITAMINS) TABS daily. 09/08/09  Yes [provider]  Omega 3 1000 MG CAPS 1,000 mg daily. 06/26/12  Yes [provider]  pseudoephedrine-acetaminophen (TYLENOL SINUS) 30-500 MG TABS tablet Take 1 tablet by mouth every 4 (four) hours as needed.   Yes [provider]  Acetaminophen (TYLENOL EXTRA STRENGTH) 167 MG/5ML LIQD 500 mg daily. 01/12/10   [provider]    Scheduled Meds: . docusate sodium   100 mg Oral BID  . insulin aspart  0-5 Units Subcutaneous QHS  . insulin aspart  0-9 Units Subcutaneous TID WC  . lactulose  30 g Oral BID  . magnesium hydroxide  30 mL Oral Daily  . [START ON 03/24/2017] pantoprazole  40 mg Intravenous Q12H  . sodium chloride flush  3 mL Intravenous Q12H   Infusions: . sodium chloride 50 mL/hr at 03/20/17 1516  . pantoprozole (PROTONIX) infusion     PRN Meds: acetaminophen **OR** acetaminophen, bisacodyl, HYDROcodone-acetaminophen, morphine injection, ondansetron **OR** ondansetron (ZOFRAN) IV   Allergies as of 03/20/2017 - Review Complete 03/20/2017  Allergen Reaction Noted  . Statins  07/30/2013  . Chocolate Rash 07/30/2013    Family History  Problem Relation Age of Onset  . Cancer Mother   . AAA (abdominal aortic aneurysm) Father   . Diabetes Mellitus II Brother     Social History   Social History  . Marital status: Married    Spouse name: N/A  . Number of children: N/A  . Years of education: N/A   Occupational History  . Not on file.   Social History Main Topics  . Smoking status: Never Smoker  . Smokeless tobacco: Never Used  . Alcohol use Yes     Comment: occas.  . Drug use: No  . Sexual activity: Not on file   Other Topics Concern  . Not on file   Social History Narrative  . No narrative on file    REVIEW OF SYSTEMS: Constitutional:  See HPI ENT:  No nose bleeds Pulm:  No SOB or cough CV:  No palpitations, no LE edema.  GU:  No hematuria, no frequency GI:  Per HPI Heme:  Per HPI   Transfusions: Has never required previous blood transfusions. Neuro:  No headaches, no peripheral tingling or numbness Derm:  No itching, no rash or sores.  Endocrine:  No sweats or chills.  No polyuria or dysuria Immunization: Did not inquire as to recent immunizations. Travel:  None beyond local counties in last few months.    PHYSICAL EXAM: Vital signs in last 24 hours: Vitals:   03/21/17 0621 03/21/17 0800  BP:  106/75    Pulse:  99  Resp:  18  Temp: 98.2 F (36.8 C)   SpO2:  96%   Wt Readings from Last 3 Encounters:  03/21/17 86.9 kg (191 lb 9.6 oz)  09/27/16 87.5 kg (193 lb)  07/30/13 85 kg (187 lb  8 oz)    General: Pleasant, well-appearing WM. Head: No facial asymmetry or swelling.  No signs of head trauma. Eyes: No scleral icterus, no conjunctival pallor.  EOMI. Ears: Not hard of hearing Nose: No congestion or discharge. Mouth: Moist, clear oropharynx.  Good dentition.  Tongue midline.  No visible blood. Neck: No JVD, no thyromegaly, no masses. Lungs: Clear bilaterally.  No dyspnea.  No cough Heart: RRR.  No MRG.  S1, S2 present Abdomen: Soft.  Not tender.  Not distended.  Active bowel sounds.  No HSM or masses.  No hernias, no bruits..   Rectal: Did not perform rectal exam.  This was done in the ED yesterday and revealed formed, black stool.  It tested FOBT positive. Musc/Skeltl: No joint erythema, swelling or gross deformities. Extremities: Slight right ankle/pedal edema, nonpitting. Neurologic: Alert.  Oriented x3.  Moves all 4 limbs with full strength.  No tremor, no gross deficits. Skin: No telangiectasia, some purpura on the forearms. Tattoos: None observed Nodes: No cervical or inguinal adenopathy. Psych: Pleasant, calm, cooperative.  Speech fluid  Intake/Output from previous day: 10/28 0701 - 10/29 0700 In: 3166.7 [P.O.:480; I.V.:586.7; IV Piggyback:2100] Out: 500 [Urine:500] Intake/Output this shift: Total I/O In: 243 [P.O.:240; I.V.:3] Out: 320 [Urine:320]  LAB RESULTS:  Recent Labs  03/20/17 1012 03/20/17 1034 03/20/17 1833 03/21/17 0215  WBC 13.1*  --  11.4* 10.3  HGB 13.2 12.9* 10.7* 10.3*  HCT 39.1 38.0* 31.8* 30.4*  PLT 226  --  224 220   BMET Lab Results  Component Value Date   NA 137 03/21/2017   NA 136 03/20/2017   NA 136 03/20/2017   K 3.8 03/21/2017   K 4.2 03/20/2017   K 4.3 03/20/2017   CL 104 03/21/2017   CL 101 03/20/2017   CL 103 03/20/2017    CO2 27 03/21/2017   CO2 23 03/20/2017   GLUCOSE 81 03/21/2017   GLUCOSE 178 (H) 03/20/2017   GLUCOSE 181 (H) 03/20/2017   BUN 37 (H) 03/21/2017   BUN 50 (H) 03/20/2017   BUN 55 (H) 03/20/2017   CREATININE 1.16 03/21/2017   CREATININE 1.20 03/20/2017   CREATININE 1.31 (H) 03/20/2017   CALCIUM 7.5 (L) 03/21/2017   CALCIUM 8.5 (L) 03/20/2017   LFT  Recent Labs  03/20/17 1012  PROT 5.3*  ALBUMIN 3.3*  AST 27  ALT 29  ALKPHOS 68  BILITOT 1.0   PT/INR No results found for: INR, PROTIME Hepatitis Panel No results for input(s): HEPBSAG, HCVAB, HEPAIGM, HEPBIGM in the last 72 hours. C-Diff No components found for: CDIFF Lipase     Component Value Date/Time   LIPASE 33 03/20/2017 1012    Drugs of Abuse  No results found for: LABOPIA, COCAINSCRNUR, LABBENZ, AMPHETMU, THCU, LABBARB   RADIOLOGY STUDIES: Ct Angio Abd/pel W And/or Wo Contrast  Result Date: 03/20/2017 CLINICAL DATA:  fulness in his stomach and unable to eat a lot. Soreness in the right side mid abd, problem with passing out and low blood pressure . He has no energy and a general feeling of not being his normal self. Today he had a very dark stool. EXAM: CTA ABDOMEN AND PELVIS WITH CONTRAST TECHNIQUE: Multidetector CT imaging of the abdomen and pelvis was performed using the standard protocol during bolus administration of intravenous contrast. Multiplanar reconstructed images and MIPs were obtained and reviewed to evaluate the vascular anatomy. CONTRAST:  100 mL Isovue 370 IV COMPARISON:  None. FINDINGS: VASCULAR Aorta: Mild scattered atheromatous plaque. No aneurysm,  dissection, or stenosis. Celiac: Patent without evidence of aneurysm, dissection, vasculitis or significant stenosis. SMA: Patent without evidence of aneurysm, dissection, vasculitis or significant stenosis. Renals: Single right, duplicated left. Patent without evidence of aneurysm, dissection, vasculitis, fibromuscular dysplasia or significant stenosis.  IMA: Patent without evidence of aneurysm, dissection, vasculitis or significant stenosis. Inflow: Patent without evidence of aneurysm, dissection, vasculitis or significant stenosis. Minimal scattered calcified plaque. Proximal Outflow: Bilateral common femoral and visualized portions of the superficial and profunda femoral arteries are patent without evidence of aneurysm, dissection, vasculitis or significant stenosis. Veins: No obvious venous abnormality within the limitations of this arterial phase study. Review of the MIP images confirms the above findings. NON-VASCULAR Lower chest: No acute abnormality. Hepatobiliary: No focal liver abnormality is seen. Status post cholecystectomy. No biliary dilatation. Pancreas: Unremarkable. No pancreatic ductal dilatation or surrounding inflammatory changes. Spleen: Normal in size without focal abnormality. Adrenals/Urinary Tract: Adrenal glands are unremarkable. Kidneys are normal, without renal calculi, focal lesion, or hydronephrosis. Bladder is unremarkable. Stomach/Bowel: Stomach is within normal limits. Appendix appears normal. No evidence of bowel wall thickening, distention, or inflammatory changes. Lymphatic: No abdominal or pelvic adenopathy. Reproductive: Mild prostatic enlargement. Other: No ascites.  No free air. Musculoskeletal: Mild spondylitic changes in the lumbar spine. Negative for fracture or or worrisome bone lesion. Clips from laparoscopic bilateral inguinal hernia repair. IMPRESSION: VASCULAR 1. Minimal aortoiliac atheromatous plaque, without acute finding. 2. No significant proximal mesenteric arterial occlusive disease to suggest any etiology for occlusive mesenteric ischemia. NON-VASCULAR 1. No acute findings. 2. Mild  lumbar spondylitic change. Electronically Signed   By: Lucrezia Europe M.D.   On: 03/20/2017 12:51     IMPRESSION:   *  Upper GI bleed.  *  Symptomatic blood loss anemia.  His baseline hemoglobin is unknown but with the syncope,  suspect that he had fairly acute drop in his hemoglobin due to the GI bleed rather than a slow, steady loss of blood    PLAN:     *  Needs upper endoscopy.  However he did eat solid food for breakfast so need to delay procedure until tomorrow.  It is required that he be n.p.o. for 8 hours after any solid foods prior to sedation/endoscopy,  in the meantime he has the Protonix drip running.    *  Hgb/hct at 1400 and in AM.     Azucena Freed  03/21/2017, 11:01 AM Pager: [phone]  I have reviewed the entire case in detail with the above APP and discussed the plan in detail.  Therefore, I agree with the diagnoses recorded above. In addition,  I have personally interviewed and examined the patient and have personally reviewed any abdominal/pelvic CT scan images.  My additional thoughts are as follows:  He is stable with a probable UGIB presenting with melena and acute blood loss anemia.  Hgb stable from last night to this AM. Possible ulcer, AVM.  Less likely malignancy.  Describes few months early satiety, denies dysphagia or weight loss.  Agree with EGD tomorrow.  Discussed procedure and risks with patient, and he is agreeable.   [name] Pager [phone]  Mon-Fri 8a-5p [phone] after 5p, weekends, holidays

## 2017-03-21 NOTE — Progress Notes (Signed)
VASCULAR LAB PRELIMINARY  PRELIMINARY  PRELIMINARY  PRELIMINARY  Carotid duplex completed.    Preliminary report may be found under results review.  Zaia Carre, RVT 03/21/2017, 4:21 PM

## 2017-03-22 ENCOUNTER — Encounter (HOSPITAL_COMMUNITY)
Admission: EM | Disposition: A | Discharge status: Home / Self Care | Payer: Self-pay | Source: Home / Self Care | Attending: Internal Medicine

## 2017-03-22 ENCOUNTER — Inpatient Hospital Stay (HOSPITAL_COMMUNITY): Payer: Medicare Other | Admitting: Certified Registered"

## 2017-03-22 ENCOUNTER — Encounter (HOSPITAL_COMMUNITY): Payer: Self-pay | Admitting: Certified Registered"

## 2017-03-22 DIAGNOSIS — K254 Chronic or unspecified gastric ulcer with hemorrhage: Secondary | ICD-10-CM

## 2017-03-22 HISTORY — PX: ESOPHAGOGASTRODUODENOSCOPY: SHX5428

## 2017-03-22 LAB — CBC
HCT: 27.5 % — ABNORMAL LOW (ref 39.0–52.0)
Hemoglobin: 9.3 g/dL — ABNORMAL LOW (ref 13.0–17.0)
MCH: 32.9 pg (ref 26.0–34.0)
MCHC: 33.8 g/dL (ref 30.0–36.0)
MCV: 97.2 fL (ref 78.0–100.0)
Platelets: 198 10*3/uL (ref 150–400)
RBC: 2.83 MIL/uL — ABNORMAL LOW (ref 4.22–5.81)
RDW: 12.3 % (ref 11.5–15.5)
WBC: 6.7 10*3/uL (ref 4.0–10.5)

## 2017-03-22 LAB — BASIC METABOLIC PANEL
Anion gap: 3 — ABNORMAL LOW (ref 5–15)
BUN: 18 mg/dL (ref 6–20)
CO2: 30 mmol/L (ref 22–32)
CREATININE: 1.19 mg/dL (ref 0.61–1.24)
Calcium: 8.1 mg/dL — ABNORMAL LOW (ref 8.9–10.3)
Chloride: 105 mmol/L (ref 101–111)
GFR calc Af Amer: >60 mL/min (ref >60)
GLUCOSE: 99 mg/dL (ref 65–99)
Potassium: 4 mmol/L (ref 3.5–5.1)
SODIUM: 138 mmol/L (ref 135–145)

## 2017-03-22 LAB — GLUCOSE, CAPILLARY
GLUCOSE-CAPILLARY: 95 mg/dL (ref 65–99)
GLUCOSE-CAPILLARY: 90 mg/dL (ref 65–99)
GLUCOSE-CAPILLARY: 89 mg/dL (ref 65–99)

## 2017-03-22 SURGERY — EGD (ESOPHAGOGASTRODUODENOSCOPY)
Anesthesia: Monitor Anesthesia Care

## 2017-03-22 MED ORDER — PANTOPRAZOLE SODIUM 40 MG PO TBEC: 40.0000 mg | DELAYED_RELEASE_TABLET | Freq: Two times a day (BID) | ORAL | Status: DC

## 2017-03-22 MED ORDER — BUTAMBEN-TETRACAINE-BENZOCAINE 2-2-14 % EX AERO
INHALATION_SPRAY | CUTANEOUS | Status: DC | PRN
  Administered 2017-03-22: 1 via TOPICAL

## 2017-03-22 MED ORDER — PROPOFOL 500 MG/50ML IV EMUL
INTRAVENOUS | Status: DC | PRN
  Administered 2017-03-22: 100 ug/kg/min via INTRAVENOUS

## 2017-03-22 MED ORDER — PANTOPRAZOLE SODIUM 40 MG PO TBEC: 40.0000 mg | DELAYED_RELEASE_TABLET | Freq: Two times a day (BID) | ORAL | 1 refills | Status: DC

## 2017-03-22 MED ORDER — FERROUS SULFATE 325 (65 FE) MG PO TBEC: 325.0000 mg | DELAYED_RELEASE_TABLET | Freq: Two times a day (BID) | ORAL | 0 refills | Status: DC

## 2017-03-22 MED ORDER — LACTATED RINGERS IV SOLN
INTRAVENOUS | Status: DC | PRN
  Administered 2017-03-22: 10:00:00 via INTRAVENOUS

## 2017-03-22 MED ORDER — PROPOFOL 10 MG/ML IV BOLUS
INTRAVENOUS | Status: DC | PRN
  Administered 2017-03-22 (×3): 20 mg via INTRAVENOUS

## 2017-03-22 NOTE — Anesthesia Preprocedure Evaluation (Addendum)
Anesthesia Evaluation  Patient identified by MRN, date of birth, ID band Patient awake    Reviewed: Allergy & Precautions, NPO status , Patient's Chart, lab work & pertinent test results  Airway Mallampati: II  TM Distance: >3 FB Neck ROM: Full    Dental  (+) Teeth Intact, Dental Advisory Given   Pulmonary    breath sounds clear to auscultation       Cardiovascular  Rhythm:Regular Rate:Normal     Neuro/Psych    GI/Hepatic   Endo/Other    Renal/GU      Musculoskeletal   Abdominal   Peds  Hematology   Anesthesia Other Findings   Reproductive/Obstetrics                            Anesthesia Physical Anesthesia Plan  ASA: III  Anesthesia Plan: MAC   Post-op Pain Management:    Induction: Intravenous  PONV Risk Score and Plan: Propofol infusion  Airway Management Planned: Nasal Cannula and Natural Airway  Additional Equipment:   Intra-op Plan:   Post-operative Plan:   Informed Consent: I have reviewed the patients History and Physical, chart, labs and discussed the procedure including the risks, benefits and alternatives for the proposed anesthesia with the patient or authorized representative who has indicated his/her understanding and acceptance.   Dental advisory given  Plan Discussed with: CRNA and Anesthesiologist  Anesthesia Plan Comments:        Anesthesia Quick Evaluation

## 2017-03-22 NOTE — Transfer of Care (Signed)
Immediate Anesthesia Transfer of Care Note  Patient: Todd Caldwell  Procedure(s) Performed: ESOPHAGOGASTRODUODENOSCOPY (EGD) (N/A )  Patient Location: Endoscopy Unit  Anesthesia Type:MAC  Level of Consciousness: awake, oriented and patient cooperative  Airway & Oxygen Therapy: Patient Spontanous Breathing and Patient connected to nasal cannula oxygen  Post-op Assessment: Report given to RN, Post -op Vital signs reviewed and stable and Patient moving all extremities  Post vital signs: Reviewed and stable  Last Vitals:  Vitals:   03/22/17 0431 03/22/17 0926  BP: 116/65 137/77  Pulse: 85   Resp: 18 19  Temp: 36.7 C 36.7 C  SpO2: 97% 94%    Last Pain:  Vitals:   03/22/17 0926  TempSrc: Oral  PainSc:          Complications: No apparent anesthesia complications

## 2017-03-22 NOTE — Progress Notes (Signed)
Reviewed discharge instructions/AVS with patient. Answered questions. Patient is stable and ready for discharge.

## 2017-03-22 NOTE — Discharge Summary (Signed)
Physician Discharge Summary  Todd Caldwell DGL:[id number] DOB: [birth year] DOA: 03/20/2017  PCP: Crist Infante, MD  Admit date: 03/20/2017 Discharge date: 03/22/2017   Recommendations for Outpatient Follow-Up:   GI recommendations: - 3 days from now, begin twice daily iron sulfate  tablets for 4 weeks.                           - No aspirin, ibuprofen, naproxen, or other  non-steroidal anti-inflammatory drugs.                           - Await pathology results. Plans for office follow up to be made after those results.                           - Use Protonix (pantoprazole) 40 mg PO twice daily for 6 weeks. Follow blood sugars Holding BP Meds, resume as tolerated  Discharge Diagnosis:   Principal Problem:   Syncope Active Problems:   Obesity (BMI 30-39.9)   Hyperlipidemia   Elevated lactic acid level   Acute kidney injury ([place])   Hyperglycemia   Constipation   Chronic gastric ulcer with hemorrhage   Discharge disposition:  Home.  Discharge Condition: Improved.  Diet recommendation: Low sodium, heart healthy  Wound care: None.   History of Present Illness:   Todd Caldwell is a very pleasant 66 y.o. male with medical history significant for hyperglycemia, hyperlipidemia, obesity, palpitations emergency department chief complaint 2 episodes of syncope.  Relation concerning for dehydration orthostatic hypotension vasovagal response in the setting of an elevated lactic acid.  [facility] are asked to admit  Information is obtained from the patient and his wife is at the bedside.  He states he was in his usual state of health until this morning he awakened state he had to go to the restroom.  She was ambulating to the restroom he felt a little dizzy and nauseous.  He sat down on the toilet members leaning forward as he did not feel well and then his next memory is waking up on the floor.  He states he felt like he had to have a bowel movement but he had not  strained to do so.  He got up went back to bed and slept for another couple hours.  He awakened to go the bathroom to empty his bladder. He syncopized again.  He denies headache dizziness visual disturbances numbness tingling of extremities.  He denies difficulty chewing or swallowing.  Denies chest pain palpitation shortness of breath.  He does endorse some nausea with dry heaves.  He also reports some abdominal distention and mild discomfort in the right upper quadrant.  Did have a bowel movement that was normal in color and consistency.  He denies dark stool bright red blood per rectum.  He denies dysuria hematuria frequency or urgency.  He denies lower extremity edema orthopnea recent travel or sick contacts.     Hospital Course by Problem:   Syncope.  -received 2 L normal saline lactic acid trending down -Urinalysis unremarkable  -CT of the chest/abdomenwithout infiltrate  -FOBT + - orthostatic vital signs negative -2D echo:- Left ventricle: The cavity size was normal. Systolic function was vigorous. The estimated ejection fraction was in the range of 65% to 70%. Wall motion was normal; there were no regional wall motion abnormalities. Doppler parameters are consistent with  abnormal left ventricular relaxation (grade 1 diastolic dysfunction).   Dark tarry bowel movements: s/p EGD: non-bleeding gastric ulcer with a flat central vessel was found in       the gastric fundus. The ulcer was 8 mm in largest dimension. No       intervention was taken on the ulcer. Biopsies were taken from the antrum       and body with a cold forceps for histology (to rule out H. pylori). -Hgb stable -recommendations: - 3 days from now, begin twice daily iron sulfate  tablets for 4 weeks.                           - No aspirin, ibuprofen, naproxen, or other non-steroidal anti-inflammatory drugs.                           - Await pathology results. Plans for office follow  up to be made after  those results.                           - Use Protonix (pantoprazole) 40 mg PO twice daily  for 6 weeks.  Hyperglycemia.  -Serum glucose 178 on admission -No history of diabetes. -hemoglobin A1c: 5.5  Acute kidney injury. Creatinine 1.31 on admission. Likely related to decreased oral intake -improved  Elevated lactic acid. Related to dehydration. Lactic acid 3.03 upon presentation. At admission after fluid resuscitation lactic acid 1.03. He remains afebrile hemodynamically stable nontoxic-appearing  hypocalemia -replete IV    Medical Consultants:    GI   Discharge Exam:   Vitals:   03/22/17 1050 03/22/17 1145  BP: 129/83 120/76  Pulse: 81 79  Resp: 17 18  Temp:  97.8 F (36.6 C)  SpO2: 96% 98%   Vitals:   03/22/17 1042 03/22/17 1043 03/22/17 1050 03/22/17 1145  BP: 129/75  129/83 120/76  Pulse: 80 81 81 79  Resp: 13 16 17 18   Temp:    97.8 F (36.6 C)  TempSrc:    Oral  SpO2: 96% 95% 96% 98%  Weight:      Height:        Gen:  NAD   The results of significant diagnostics from this hospitalization (including imaging, microbiology, ancillary and laboratory) are listed below for reference.     Procedures and Diagnostic Studies:   Ct Angio Abd/pel W And/or Wo Contrast  Result Date: 03/20/2017 CLINICAL DATA:  fulness in his stomach and unable to eat a lot. Soreness in the right side mid abd, problem with passing out and low blood pressure . He has no energy and a general feeling of not being his normal self. Today he had a very dark stool. EXAM: CTA ABDOMEN AND PELVIS WITH CONTRAST TECHNIQUE: Multidetector CT imaging of the abdomen and pelvis was performed using the standard protocol during bolus administration of intravenous contrast. Multiplanar reconstructed images and MIPs were obtained and reviewed to evaluate the vascular anatomy. CONTRAST:  100 mL Isovue 370 IV COMPARISON:  None. FINDINGS: VASCULAR Aorta: Mild scattered atheromatous plaque. No  aneurysm, dissection, or stenosis. Celiac: Patent without evidence of aneurysm, dissection, vasculitis or significant stenosis. SMA: Patent without evidence of aneurysm, dissection, vasculitis or significant stenosis. Renals: Single right, duplicated left. Patent without evidence of aneurysm, dissection, vasculitis, fibromuscular dysplasia or significant stenosis. IMA: Patent without evidence of aneurysm, dissection, vasculitis or significant stenosis.  Inflow: Patent without evidence of aneurysm, dissection, vasculitis or significant stenosis. Minimal scattered calcified plaque. Proximal Outflow: Bilateral common femoral and visualized portions of the superficial and profunda femoral arteries are patent without evidence of aneurysm, dissection, vasculitis or significant stenosis. Veins: No obvious venous abnormality within the limitations of this arterial phase study. Review of the MIP images confirms the above findings. NON-VASCULAR Lower chest: No acute abnormality. Hepatobiliary: No focal liver abnormality is seen. Status post cholecystectomy. No biliary dilatation. Pancreas: Unremarkable. No pancreatic ductal dilatation or surrounding inflammatory changes. Spleen: Normal in size without focal abnormality. Adrenals/Urinary Tract: Adrenal glands are unremarkable. Kidneys are normal, without renal calculi, focal lesion, or hydronephrosis. Bladder is unremarkable. Stomach/Bowel: Stomach is within normal limits. Appendix appears normal. No evidence of bowel wall thickening, distention, or inflammatory changes. Lymphatic: No abdominal or pelvic adenopathy. Reproductive: Mild prostatic enlargement. Other: No ascites.  No free air. Musculoskeletal: Mild spondylitic changes in the lumbar spine. Negative for fracture or or worrisome bone lesion. Clips from laparoscopic bilateral inguinal hernia repair. IMPRESSION: VASCULAR 1. Minimal aortoiliac atheromatous plaque, without acute finding. 2. No significant proximal  mesenteric arterial occlusive disease to suggest any etiology for occlusive mesenteric ischemia. NON-VASCULAR 1. No acute findings. 2. Mild  lumbar spondylitic change. Electronically Signed   By: Lucrezia Europe M.D.   On: 03/20/2017 12:51     Labs:   Basic Metabolic Panel:  Recent Labs Lab 03/20/17 1012 03/20/17 1034 03/21/17 0215 03/22/17 0610  NA 136 136 137 138  K 4.3 4.2 3.8 4.0  CL 103 101 104 105  CO2 23  --  27 30  GLUCOSE 181* 178* 81 99  BUN 55* 50* 37* 18  CREATININE 1.31* 1.20 1.16 1.19  CALCIUM 8.5*  --  7.5* 8.1*   GFR Estimated Creatinine Clearance: 63.3 mL/min (by C-G formula based on SCr of 1.19 mg/dL). Liver Function Tests:  Recent Labs Lab 03/20/17 1012  AST 27  ALT 29  ALKPHOS 68  BILITOT 1.0  PROT 5.3*  ALBUMIN 3.3*    Recent Labs Lab 03/20/17 1012  LIPASE 33   No results for input(s): AMMONIA in the last 168 hours. Coagulation profile No results for input(s): INR, PROTIME in the last 168 hours.  CBC:  Recent Labs Lab 03/20/17 1012 03/20/17 1034 03/20/17 1833 03/21/17 0215 03/21/17 1238 03/22/17 0610  WBC 13.1*  --  11.4* 10.3  --  6.7  NEUTROABS 9.7*  --   --   --   --   --   HGB 13.2 12.9* 10.7* 10.3* 10.4* 9.3*  HCT 39.1 38.0* 31.8* 30.4* 30.8* 27.5*  MCV 96.5  --  96.4 96.5  --  97.2  PLT 226  --  224 220  --  198   Cardiac Enzymes:  Recent Labs Lab 03/20/17 2015 03/21/17 0215 03/21/17 1133  TROPONINI <0.03 <0.03 <0.03   BNP: Invalid input(s): POCBNP CBG:  Recent Labs Lab 03/21/17 1653 03/21/17 2121 03/22/17 0645 03/22/17 0741 03/22/17 1145  GLUCAP 116* 82 95 90 89   D-Dimer No results for input(s): DDIMER in the last 72 hours. Hgb A1c  Recent Labs  03/20/17 1833  HGBA1C 5.5   Lipid Profile No results for input(s): CHOL, HDL, LDLCALC, TRIG, CHOLHDL, LDLDIRECT in the last 72 hours. Thyroid function studies  Recent Labs  03/20/17 1833  TSH 0.559   Anemia work up  Recent Labs  03/21/17 [place]   Microbiology Recent Results (from the past 240 hour(s))  Urine culture     Status: None   Collection Time: 03/20/17  1:09 PM  Result Value Ref Range Status   Specimen Description URINE, RANDOM  Final   Special Requests NONE  Final   Culture NO GROWTH  Final   Report Status 03/21/2017 FINAL  Final     Discharge Instructions:   Discharge Instructions    Diet - low sodium heart healthy    Complete by:  As directed    Discharge instructions    Complete by:  As directed    - 3 days from now, begin twice daily iron sulfate tablets for 4 weeks. - No aspirin, ibuprofen, naproxen, or other non-steroidal anti-inflammatory drugs. - Await pathology results. Plans for office follow up to be made after those results. - Use Protonix (pantoprazole) 40 mg PO twice daily for 6 weeks.   Increase activity slowly    Complete by:  As directed      Allergies as of 03/22/2017      Reactions   Statins    myalgias   Chocolate Rash      Medication List    STOP taking these medications   aspirin 81 MG tablet   chlorthalidone 25 MG tablet Commonly known as:  HYGROTON   losartan 25 MG tablet Commonly known as:  COZAAR   Omega 3 1000 MG Caps   PX MENS MULTIVITAMINS Tabs   TYLENOL EXTRA STRENGTH 167 MG/5ML Liqd Generic drug:  Acetaminophen     TAKE these medications   B-12 1000 MCG Caps daily.   cholecalciferol 1000 units tablet Commonly known as:  VITAMIN D daily.   ezetimibe 10 MG tablet Commonly known as:  ZETIA Take 10 mg by mouth daily.   ferrous sulfate 325 (65 FE) MG EC tablet Commonly known as:  FE TABS Take 1 tablet (325 mg total) by mouth 2 (two) times daily. Start taking on:  03/26/2017   pantoprazole 40 MG tablet Commonly known as:  PROTONIX Take 1 tablet (40 mg total) by mouth 2 (two) times daily before a meal.   pseudoephedrine-acetaminophen 30-500 MG Tabs tablet Commonly known as:  TYLENOL SINUS Take 1 tablet by mouth every 4 (four) hours  as needed.      Follow-up Information    Crist Infante, MD Follow up in 1 week(s).   Specialty:  Internal Medicine Contact information: [place] [address] [phone]        Doran Stabler, MD. Schedule an appointment as soon as possible for a visit in 2 week(s).   Specialty:  Gastroenterology Why:  for biopsy results Contact information: [place] [address] [phone]            Time coordinating discharge: 35 min  Signed:  Georgiann Neider U Lisa Blakeman   [facility] 03/22/2017, 1:16 PM

## 2017-03-22 NOTE — H&P (View-Only) (Signed)
[facility] Consult: 11:01 AM 03/21/2017  LOS: 0 days    Referring Provider: Dr Todd Caldwell  Primary Care Physician:  Todd Infante, MD Primary Gastroenterologist:  Dr.  Fuller Caldwell Wife Todd Caldwell [phone]    Reason for Consultation:  Tarry stool.     HPI: Todd Caldwell is a 66 y.o. male.  Takes 81 mg ASA daily.  Htn.  B12 deficiency.   2006 Colonoscopy, avg risk screening.  Normal study.  Re screen colonoscopy in 03/2015 Early 2018 Cologuard study.  Submitted specimen and never heard back from Dr. Haynes Caldwell, assumes it was unremarkable. Patient has never undergone upper endoscopy.  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  Last week he was experiencing some sense of abdominal bloating, fullness.  He felt hungry but when he ate, he did not need to eat as much to feel full.  Did not have any nausea or vomiting or change in the appearance of his daily, brown stools.  He also noticed that when he would lean forward, putting pressure on his stomach, he would feel a little bit dizzy but this would resolve when he stood up.  This had not previously been an issue Yesterday, 03/20/17, at 4 AM he got up and sat on the commode to have a bowel movement.  He had a syncopal spell but woke up quickly and when he passed a stool it was black mixed with brown in color.  He went back to bed.  At 7:30 AM he got up to urinate and on the way to the bathroom he was syncopal.  He called his wife, who was at her mother's where she is the caregiver, to come get him.  He proceeded to the ED where he had a third dark stool at 9:30 in the morning.  Patient takes an 81 mg aspirin once a week.  He does not take NSAIDs other than maybe once every several weeks.  He does not drink alcohol.  He has no prior history of any liver problems.   He is never required blood transfusions or had GI bleeding he tends to bruise easily.  Previously took ranitidine for GER but has not needed to use this in a long time, several years. In the ED initial blood pressure 98/66 with pulse of 116.  Continues to have intermittent tachycardia in the low 100s and blood pressure in the low 100s over 60s-70s.  Last bowel movement was soft and black, this morning.  Unfortunately he ate a breakfast of solid food today. Hgb 10.7 >> 10.3.  No prior Hgb to compare.  MCV 96.  Platelets normal.  BUN 55 >> 37.  Lactic acid 1 >> 1.0.  FOBT +.   CT angio: minor aortiliac plaque, no mesenteric vascular disease.    Past Medical History:  Diagnosis Date  . GERD (gastroesophageal reflux disease)     History reviewed. No pertinent surgical history.  Prior to Admission medications   Medication Sig Start Date End Date Taking? Authorizing Provider  aspirin 81 MG tablet Take 81 mg  by mouth once a week. Sunday of each week   Yes [provider]  chlorthalidone (HYGROTON) 25 MG tablet Take 12.5 mg by mouth daily.   Yes [provider]  cholecalciferol (VITAMIN D) 1000 UNITS tablet daily. 09/08/09  Yes [provider]  Cyanocobalamin (B-12) 1000 MCG CAPS daily. 09/08/09  Yes [provider]  ezetimibe (ZETIA) 10 MG tablet Take 10 mg by mouth daily.   Yes [provider]  losartan (COZAAR) 25 MG tablet Take 25 mg by mouth daily. 07/26/16  Yes [provider]  Multiple Vitamins-Minerals (PX MENS MULTIVITAMINS) TABS daily. 09/08/09  Yes [provider]  Omega 3 1000 MG CAPS 1,000 mg daily. 06/26/12  Yes [provider]  pseudoephedrine-acetaminophen (TYLENOL SINUS) 30-500 MG TABS tablet Take 1 tablet by mouth every 4 (four) hours as needed.   Yes [provider]  Acetaminophen (TYLENOL EXTRA STRENGTH) 167 MG/5ML LIQD 500 mg daily. 01/12/10   [provider]    Scheduled Meds: . docusate sodium   100 mg Oral BID  . insulin aspart  0-5 Units Subcutaneous QHS  . insulin aspart  0-9 Units Subcutaneous TID WC  . lactulose  30 g Oral BID  . magnesium hydroxide  30 mL Oral Daily  . [START ON 03/24/2017] pantoprazole  40 mg Intravenous Q12H  . sodium chloride flush  3 mL Intravenous Q12H   Infusions: . sodium chloride 50 mL/hr at 03/20/17 1516  . pantoprozole (PROTONIX) infusion     PRN Meds: acetaminophen **OR** acetaminophen, bisacodyl, HYDROcodone-acetaminophen, morphine injection, ondansetron **OR** ondansetron (ZOFRAN) IV   Allergies as of 03/20/2017 - Review Complete 03/20/2017  Allergen Reaction Noted  . Statins  07/30/2013  . Chocolate Rash 07/30/2013    Family History  Problem Relation Age of Onset  . Cancer Mother   . AAA (abdominal aortic aneurysm) Father   . Diabetes Mellitus II Brother     Social History   Social History  . Marital status: Married    Spouse name: N/A  . Number of children: N/A  . Years of education: N/A   Occupational History  . Not on file.   Social History Main Topics  . Smoking status: Never Smoker  . Smokeless tobacco: Never Used  . Alcohol use Yes     Comment: occas.  . Drug use: No  . Sexual activity: Not on file   Other Topics Concern  . Not on file   Social History Narrative  . No narrative on file    REVIEW OF SYSTEMS: Constitutional:  See HPI ENT:  No nose bleeds Pulm:  No SOB or cough CV:  No palpitations, no LE edema.  GU:  No hematuria, no frequency GI:  Per HPI Heme:  Per HPI   Transfusions: Has never required previous blood transfusions. Neuro:  No headaches, no peripheral tingling or numbness Derm:  No itching, no rash or sores.  Endocrine:  No sweats or chills.  No polyuria or dysuria Immunization: Did not inquire as to recent immunizations. Travel:  None beyond local counties in last few months.    PHYSICAL EXAM: Vital signs in last 24 hours: Vitals:   03/21/17 0621 03/21/17 0800  BP:  106/75    Pulse:  99  Resp:  18  Temp: 98.2 F (36.8 C)   SpO2:  96%   Wt Readings from Last 3 Encounters:  03/21/17 86.9 kg (191 lb 9.6 oz)  09/27/16 87.5 kg (193 lb)  07/30/13 85 kg (187 lb  8 oz)    General: Pleasant, well-appearing WM. Head: No facial asymmetry or swelling.  No signs of head trauma. Eyes: No scleral icterus, no conjunctival pallor.  EOMI. Ears: Not hard of hearing Nose: No congestion or discharge. Mouth: Moist, clear oropharynx.  Good dentition.  Tongue midline.  No visible blood. Neck: No JVD, no thyromegaly, no masses. Lungs: Clear bilaterally.  No dyspnea.  No cough Heart: RRR.  No MRG.  S1, S2 present Abdomen: Soft.  Not tender.  Not distended.  Active bowel sounds.  No HSM or masses.  No hernias, no bruits..   Rectal: Did not perform rectal exam.  This was done in the ED yesterday and revealed formed, black stool.  It tested FOBT positive. Musc/Skeltl: No joint erythema, swelling or gross deformities. Extremities: Slight right ankle/pedal edema, nonpitting. Neurologic: Alert.  Oriented x3.  Moves all 4 limbs with full strength.  No tremor, no gross deficits. Skin: No telangiectasia, some purpura on the forearms. Tattoos: None observed Nodes: No cervical or inguinal adenopathy. Psych: Pleasant, calm, cooperative.  Speech fluid  Intake/Output from previous day: 10/28 0701 - 10/29 0700 In: 3166.7 [P.O.:480; I.V.:586.7; IV Piggyback:2100] Out: 500 [Urine:500] Intake/Output this shift: Total I/O In: 243 [P.O.:240; I.V.:3] Out: 320 [Urine:320]  LAB RESULTS:  Recent Labs  03/20/17 1012 03/20/17 1034 03/20/17 1833 03/21/17 0215  WBC 13.1*  --  11.4* 10.3  HGB 13.2 12.9* 10.7* 10.3*  HCT 39.1 38.0* 31.8* 30.4*  PLT 226  --  224 220   BMET Lab Results  Component Value Date   NA 137 03/21/2017   NA 136 03/20/2017   NA 136 03/20/2017   K 3.8 03/21/2017   K 4.2 03/20/2017   K 4.3 03/20/2017   CL 104 03/21/2017   CL 101 03/20/2017   CL 103 03/20/2017    CO2 27 03/21/2017   CO2 23 03/20/2017   GLUCOSE 81 03/21/2017   GLUCOSE 178 (H) 03/20/2017   GLUCOSE 181 (H) 03/20/2017   BUN 37 (H) 03/21/2017   BUN 50 (H) 03/20/2017   BUN 55 (H) 03/20/2017   CREATININE 1.16 03/21/2017   CREATININE 1.20 03/20/2017   CREATININE 1.31 (H) 03/20/2017   CALCIUM 7.5 (L) 03/21/2017   CALCIUM 8.5 (L) 03/20/2017   LFT  Recent Labs  03/20/17 1012  PROT 5.3*  ALBUMIN 3.3*  AST 27  ALT 29  ALKPHOS 68  BILITOT 1.0   PT/INR No results found for: INR, PROTIME Hepatitis Panel No results for input(s): HEPBSAG, HCVAB, HEPAIGM, HEPBIGM in the last 72 hours. C-Diff No components found for: CDIFF Lipase     Component Value Date/Time   LIPASE 33 03/20/2017 1012    Drugs of Abuse  No results found for: LABOPIA, COCAINSCRNUR, LABBENZ, AMPHETMU, THCU, LABBARB   RADIOLOGY STUDIES: Ct Angio Abd/pel W And/or Wo Contrast  Result Date: 03/20/2017 CLINICAL DATA:  fulness in his stomach and unable to eat a lot. Soreness in the right side mid abd, problem with passing out and low blood pressure . He has no energy and a general feeling of not being his normal self. Today he had a very dark stool. EXAM: CTA ABDOMEN AND PELVIS WITH CONTRAST TECHNIQUE: Multidetector CT imaging of the abdomen and pelvis was performed using the standard protocol during bolus administration of intravenous contrast. Multiplanar reconstructed images and MIPs were obtained and reviewed to evaluate the vascular anatomy. CONTRAST:  100 mL Isovue 370 IV COMPARISON:  None. FINDINGS: VASCULAR Aorta: Mild scattered atheromatous plaque. No aneurysm,  dissection, or stenosis. Celiac: Patent without evidence of aneurysm, dissection, vasculitis or significant stenosis. SMA: Patent without evidence of aneurysm, dissection, vasculitis or significant stenosis. Renals: Single right, duplicated left. Patent without evidence of aneurysm, dissection, vasculitis, fibromuscular dysplasia or significant stenosis.  IMA: Patent without evidence of aneurysm, dissection, vasculitis or significant stenosis. Inflow: Patent without evidence of aneurysm, dissection, vasculitis or significant stenosis. Minimal scattered calcified plaque. Proximal Outflow: Bilateral common femoral and visualized portions of the superficial and profunda femoral arteries are patent without evidence of aneurysm, dissection, vasculitis or significant stenosis. Veins: No obvious venous abnormality within the limitations of this arterial phase study. Review of the MIP images confirms the above findings. NON-VASCULAR Lower chest: No acute abnormality. Hepatobiliary: No focal liver abnormality is seen. Status post cholecystectomy. No biliary dilatation. Pancreas: Unremarkable. No pancreatic ductal dilatation or surrounding inflammatory changes. Spleen: Normal in size without focal abnormality. Adrenals/Urinary Tract: Adrenal glands are unremarkable. Kidneys are normal, without renal calculi, focal lesion, or hydronephrosis. Bladder is unremarkable. Stomach/Bowel: Stomach is within normal limits. Appendix appears normal. No evidence of bowel wall thickening, distention, or inflammatory changes. Lymphatic: No abdominal or pelvic adenopathy. Reproductive: Mild prostatic enlargement. Other: No ascites.  No free air. Musculoskeletal: Mild spondylitic changes in the lumbar spine. Negative for fracture or or worrisome bone lesion. Clips from laparoscopic bilateral inguinal hernia repair. IMPRESSION: VASCULAR 1. Minimal aortoiliac atheromatous plaque, without acute finding. 2. No significant proximal mesenteric arterial occlusive disease to suggest any etiology for occlusive mesenteric ischemia. NON-VASCULAR 1. No acute findings. 2. Mild  lumbar spondylitic change. Electronically Signed   By: Lucrezia Europe M.D.   On: 03/20/2017 12:51     IMPRESSION:   *  Upper GI bleed.  *  Symptomatic blood loss anemia.  His baseline hemoglobin is unknown but with the syncope,  suspect that he had fairly acute drop in his hemoglobin due to the GI bleed rather than a slow, steady loss of blood    Caldwell:     *  Needs upper endoscopy.  However he did eat solid food for breakfast so need to delay procedure until tomorrow.  It is required that he be n.p.o. for 8 hours after any solid foods prior to sedation/endoscopy,  in the meantime he has the Protonix drip running.    *  Hgb/hct at 1400 and in AM.     Azucena Freed  03/21/2017, 11:01 AM Pager: [phone]  I have reviewed the entire case in detail with the above APP and discussed the Caldwell in detail.  Therefore, I agree with the diagnoses recorded above. In addition,  I have personally interviewed and examined the patient and have personally reviewed any abdominal/pelvic CT scan images.  My additional thoughts are as follows:  He is stable with a probable UGIB presenting with melena and acute blood loss anemia.  Hgb stable from last night to this AM. Possible ulcer, AVM.  Less likely malignancy.  Describes few months early satiety, denies dysphagia or weight loss.  Agree with EGD tomorrow.  Discussed procedure and risks with patient, and he is agreeable.   [name] Pager [phone]  Mon-Fri 8a-5p [phone] after 5p, weekends, holidays

## 2017-03-22 NOTE — Op Note (Addendum)
[facility] Patient Name: Todd Caldwell Procedure Date : 03/22/2017 MRN: [id number] Attending MD: Estill Cotta. Loletha Carrow , MD Date of Birth: August 28, 1950 CSN: [id number] Age: 66 Admit Type: Inpatient Procedure:                Upper GI endoscopy Indications:              Acute post hemorrhagic anemia, Melena Providers:                Mallie Mussel L. Loletha Carrow, MD, Kingsley Plan, RN, Tinnie Gens, Technician, Domingo Pulse Referring MD:              Medicines:                Monitored Anesthesia Care Complications:            No immediate complications. Estimated Blood Loss:     Estimated blood loss: none. Procedure:                Pre-Anesthesia Assessment:                           - Prior to the procedure, a History and Physical                            was performed, and patient medications and                            allergies were reviewed. The patient's tolerance of                            previous anesthesia was also reviewed. The risks                            and benefits of the procedure and the sedation                            options and risks were discussed with the patient.                            All questions were answered, and informed consent                            was obtained. Prior Anticoagulants: The patient has                            taken aspirin, last dose was 3 days prior to                            procedure. ASA Grade Assessment: III - A patient                            with severe systemic disease. After reviewing the  risks and benefits, the patient was deemed in                            satisfactory condition to undergo the procedure.                           After obtaining informed consent, the endoscope was                            passed under direct vision. Throughout the                            procedure, the patient's blood pressure, pulse, and              oxygen saturations were monitored continuously. The                            [id number] ([id number]) scope was introduced through the                            mouth, and advanced to the third part of duodenum.                            The upper GI endoscopy was accomplished without                            difficulty. The patient tolerated the procedure                            well. Scope In: Scope Out: Findings:      The esophagus was normal.      One non-bleeding gastric ulcer with a flat central vessel was found in       the gastric fundus. The ulcer was 8 mm in largest dimension. No       intervention was taken on the ulcer. Biopsies were taken from the antrum       and body with a cold forceps for histology (to rule out H. pylori).      The exam of the stomach was otherwise normal.      The examined duodenum was normal.      There was no blood in the UGI tract. Impression:               - Normal esophagus.                           - Non-bleeding gastric ulcer with a visible vessel.                           - Normal examined duodenum. Moderate Sedation:      MAC sedation used Recommendation:           - Resume regular diet.                           - Discharge patient to home today.                           -  3 days from now, begin twice daily iron sulfate                            tablets for 4 weeks.                           - No aspirin, ibuprofen, naproxen, or other                            non-steroidal anti-inflammatory drugs.                           - Await pathology results. Plans for office follow                            up to be made after those results.                           - Use Protonix (pantoprazole) 40 mg PO twice daily                            for 6 weeks. Procedure Code(s):        --- Professional ---                           [phone], Esophagogastroduodenoscopy, flexible,                            transoral; with biopsy,  single or multiple Diagnosis Code(s):        --- Professional ---                           K25.4, Chronic or unspecified gastric ulcer with                            hemorrhage                           D62, Acute posthemorrhagic anemia                           K92.1, Melena (includes Hematochezia) CPT copyright 2016 American [employer] Association. All rights reserved. The codes documented in this report are preliminary and upon coder review may  be revised to meet current compliance requirements. Henry L. Loletha Carrow, MD 03/22/2017 10:45:17 AM This report has been signed electronically. Number of Addenda: 0

## 2017-03-22 NOTE — Anesthesia Postprocedure Evaluation (Signed)
Anesthesia Post Note  Patient: Todd Caldwell  Procedure(s) Performed: ESOPHAGOGASTRODUODENOSCOPY (EGD) (N/A )     Patient location during evaluation: Endoscopy Anesthesia Type: MAC Level of consciousness: awake, awake and alert and oriented Pain management: pain level controlled Vital Signs Assessment: post-procedure vital signs reviewed and stable Respiratory status: spontaneous breathing, nonlabored ventilation and respiratory function stable Cardiovascular status: blood pressure returned to baseline Anesthetic complications: no    Last Vitals:  Vitals:   03/22/17 1050 03/22/17 1145  BP: 129/83 120/76  Pulse: 81 79  Resp: 17 18  Temp:  36.6 C  SpO2: 96% 98%    Last Pain:  Vitals:   03/22/17 1145  TempSrc: Oral  PainSc:                  [name]

## 2017-03-22 NOTE — Interval H&P Note (Signed)
History and Physical Interval Note:  03/22/2017 10:06 AM  Todd Caldwell  has presented today for surgery, with the diagnosis of anemia, upper gi bleed with melena and syncope  The various methods of treatment have been discussed with the patient and family. After consideration of risks, benefits and other options for treatment, the patient has consented to  Procedure(s): ESOPHAGOGASTRODUODENOSCOPY (EGD) (N/A) as a surgical intervention .  The patient's history has been reviewed, patient examined, no change in status, stable for surgery.  I have reviewed the patient's chart and labs.  Questions were answered to the patient's satisfaction.     [name]

## 2017-03-29 DIAGNOSIS — R030 Elevated blood-pressure reading, without diagnosis of hypertension: Secondary | ICD-10-CM | POA: Diagnosis not present

## 2017-03-29 DIAGNOSIS — K279 Peptic ulcer, site unspecified, unspecified as acute or chronic, without hemorrhage or perforation: Secondary | ICD-10-CM | POA: Diagnosis not present

## 2017-03-29 DIAGNOSIS — Z6832 Body mass index (BMI) 32.0-32.9, adult: Secondary | ICD-10-CM | POA: Diagnosis not present

## 2017-03-29 DIAGNOSIS — R609 Edema, unspecified: Secondary | ICD-10-CM | POA: Diagnosis not present

## 2017-03-29 DIAGNOSIS — K922 Gastrointestinal hemorrhage, unspecified: Secondary | ICD-10-CM | POA: Diagnosis not present

## 2017-04-05 ENCOUNTER — Ambulatory Visit (INDEPENDENT_AMBULATORY_CARE_PROVIDER_SITE_OTHER): Payer: Medicare Other | Admitting: Physician Assistant

## 2017-04-05 ENCOUNTER — Encounter: Payer: Self-pay | Admitting: Physician Assistant

## 2017-04-05 VITALS — BP 120/80 | HR 84 | Ht 67.0 in | Wt 198.4 lb

## 2017-04-05 DIAGNOSIS — D62 Acute posthemorrhagic anemia: Secondary | ICD-10-CM

## 2017-04-05 DIAGNOSIS — K250 Acute gastric ulcer with hemorrhage: Secondary | ICD-10-CM | POA: Diagnosis not present

## 2017-04-05 NOTE — Progress Notes (Addendum)
Chief Complaint: Follow-up of gastric ulcer  HPI:    Mr. Todd Caldwell is a 66 year old Caucasian male with a past medical history as listed below, who presents to clinic today for follow-up after being seen in the hospital for a bleeding gastric ulcer.    He is known to Dr. Fuller Plan for a colonoscopy in 2006.  This was normal.  Patient then had a ColoGuard study earlier this year which was negative per his report.    Patient was recently seen in the hospital 03/21/17 by our service.  This was for an acute drop in hemoglobin and symptomatic anemia related to presumed GI bleed.  Patient had an EGD by Dr. Simona Huh 03/22/17 which revealed a nonbleeding gastric ulcer 8 mm in size.  Patient was placed on iron twice daily to for 4 weeks and Protonix 40 mg twice a day for 6 weeks.    Today, the patient explains that he recently had labs drawn by his PCP on Friday of last week and tells me that these were "looking good".  Patient notes that he only has very mild maybe 1-2/10 epigastric abdominal discomfort and this is "nothing like it was".  Patient tells me overall he is much improved.  He is no longer seeing any black tarry stools.  He is using his Pantoprazole 40 mg twice a day, as well as iron twice a day.  Patient tells me that prior to being in the hospital he was dizzy and this has resolved.  In fact, the patient has returned to work and is very grateful for our service in the hospital.    Patient denies fever, chills, blood in the stool, melena, weight loss, anorexia, nausea, vomiting, heartburn, reflux or symptoms that awaken him at night.  Past Medical History:  Diagnosis Date  . Gastric ulcer   . GERD (gastroesophageal reflux disease)     Past Surgical History:  Procedure Laterality Date  . CHOLECYSTECTOMY    . HERNIA REPAIR     x 2  . TONSILLECTOMY      Current Outpatient Medications  Medication Sig Dispense Refill  . cholecalciferol (VITAMIN D) 1000 UNITS tablet daily.    . Cyanocobalamin  (B-12) 1000 MCG CAPS daily.    Marland Kitchen ezetimibe (ZETIA) 10 MG tablet Take 10 mg by mouth daily.    . ferrous sulfate (FE TABS) 325 (65 FE) MG EC tablet Take 1 tablet (325 mg total) by mouth 2 (two) times daily. 28 tablet 0  . pantoprazole (PROTONIX) 40 MG tablet Take 1 tablet (40 mg total) by mouth 2 (two) times daily before a meal. 60 tablet 1   No current facility-administered medications for this visit.     Allergies as of 04/05/2017 - Review Complete 04/05/2017  Allergen Reaction Noted  . Nsaids  04/05/2017  . Statins  07/30/2013  . Chocolate Rash 07/30/2013    Family History  Problem Relation Age of Onset  . Bladder Cancer Mother   . AAA (abdominal aortic aneurysm) Father   . Diabetes Mellitus II Brother   . Colon cancer Neg Hx     Social History   Socioeconomic History  . Marital status: Married    Spouse name: Not on file  . Number of children: Not on file  . Years of education: Not on file  . Highest education level: Not on file  Social Needs  . Financial resource strain: Not on file  . Food insecurity - worry: Not on file  . Food insecurity -  inability: Not on file  . Transportation needs - medical: Not on file  . Transportation needs - non-medical: Not on file  Occupational History  . Not on file  Tobacco Use  . Smoking status: Never Smoker  . Smokeless tobacco: Never Used  Substance and Sexual Activity  . Alcohol use: Yes    Comment: occas.  . Drug use: No  . Sexual activity: Not on file  Other Topics Concern  . Not on file  Social History Narrative  . Not on file    Review of Systems:    Constitutional: No weight loss, fever or chills Cardiovascular: No chest pain Respiratory: No SOB  Gastrointestinal: See HPI and otherwise negative   Physical Exam:  Vital signs: BP 120/80   Pulse 84   Ht 5\' 7"  (1.702 m)   Wt 198 lb 6.4 oz (90 kg)   BMI 31.07 kg/m   Constitutional:   Pleasant Caucasian male appears to be in NAD, Well developed, Well  nourished, alert and cooperative Respiratory: Respirations even and unlabored. Lungs clear to auscultation bilaterally.   No wheezes, crackles, or rhonchi.  Cardiovascular: Normal S1, S2. No MRG. Regular rate and rhythm. No peripheral edema, cyanosis or pallor.  Gastrointestinal:  Soft, nondistended, nontender. No rebound or guarding. Normal bowel sounds. No appreciable masses or hepatomegaly. Psychiatric: Demonstrates good judgement and reason without abnormal affect or behaviors.  RELEVANT LABS AND IMAGING: CBC    Component Value Date/Time   WBC 6.7 03/22/2017 0610   RBC 2.83 (L) 03/22/2017 0610   HGB 9.3 (L) 03/22/2017 0610   HCT 27.5 (L) 03/22/2017 0610   PLT 198 03/22/2017 0610   MCV 97.2 03/22/2017 0610   MCH 32.9 03/22/2017 0610   MCHC 33.8 03/22/2017 0610   RDW 12.3 03/22/2017 0610   LYMPHSABS 2.6 03/20/2017 1012   MONOABS 0.8 03/20/2017 1012   EOSABS 0.0 03/20/2017 1012   BASOSABS 0.0 03/20/2017 1012    CMP     Component Value Date/Time   NA 138 03/22/2017 0610   K 4.0 03/22/2017 0610   CL 105 03/22/2017 0610   CO2 30 03/22/2017 0610   GLUCOSE 99 03/22/2017 0610   BUN 18 03/22/2017 0610   CREATININE 1.19 03/22/2017 0610   CALCIUM 8.1 (L) 03/22/2017 0610   PROT 5.3 (L) 03/20/2017 1012   ALBUMIN 3.3 (L) 03/20/2017 1012   AST 27 03/20/2017 1012   ALT 29 03/20/2017 1012   ALKPHOS 68 03/20/2017 1012   BILITOT 1.0 03/20/2017 1012   GFRNONAA >60 03/22/2017 0610   GFRAA >60 03/22/2017 0610    Assessment: 1.  Gastric ulcer: With melena and acute drop in hemoglobin, observed 03/22/17 via EGD, patient is better on Pantoprazole 40 mg twice daily supplementation 2.  Anemia: Thought due to acute loss of blood from bleeding ulcer, PCP repeated labs   Plan: 1.  Requesting recent labs from Dr. Tempie Donning office done on Friday 2.  Recommend the patient continue Pantoprazole 40 mg twice daily for a total of 6 weeks 3.  Recommend the patient stay on his iron twice a day for 4  weeks 4.  Scheduled the patient for repeat EGD with Dr. Fuller Plan in the [facility].  I did discuss risks, benefits, limitations and alternatives and the patient agreed to proceed.  This was scheduled at Dr. Lynne Leader next available appointment in January. 5.  Recommend the patient avoid NSAIDs.  Reviewed antireflux diet and lifestyle modifications. 6.  Patient to follow in clinic as needed before time  of EGD or per recommendations after.  Ellouise Newer, PA-C [facility] 04/05/2017, 10:32 AM  Cc: Crist Infante, MD   Addendum: 04/06/17 9:29 am Received patient's most recent labs 03/29/17: BMP shows an elevated glucose at 125 and is otherwise normal.  Most recent CBC shows hemoglobin of 9.2.  LFTs are normal.  Per review of chart patient's last hemoglobin was 9.3 on 03/22/17 in the hospital.  It is remaining stable.  Will order a recheck for 2 weeks from now and encourage patient to use his iron.  Mort Sawyers lemon, PA-C

## 2017-04-05 NOTE — Patient Instructions (Addendum)
Continue Protonix twice a day for 6 weeks.   Continue Iron twice a day for 4 weeks.   You have been scheduled for an endoscopy. Please follow written instructions given to you at your visit today. If you use inhalers (even only as needed), please bring them with you on the day of your procedure. Your physician has requested that you go to www.startemmi.com and enter the access code given to you at your visit today. This web site gives a general overview about your procedure. However, you should still follow specific instructions given to you by our office regarding your preparation for the procedure.

## 2017-04-05 NOTE — Progress Notes (Signed)
Reviewed and agree with management plan.  Peniel Hass T. Dijuan Sleeth, MD FACG 

## 2017-04-06 ENCOUNTER — Telehealth: Payer: Self-pay

## 2017-04-06 DIAGNOSIS — K254 Chronic or unspecified gastric ulcer with hemorrhage: Secondary | ICD-10-CM

## 2017-04-06 NOTE — Telephone Encounter (Signed)
Left message on machine to call back  

## 2017-04-06 NOTE — Telephone Encounter (Signed)
-----   Message from [place], [place] sent at 04/06/2017  9:30 AM EST ----- Regarding: labs Todd Caldwell,  I received patient's most recent labs and his hemoglobin has actually dropped by 1/10 of a point (not much), this is not concerning but I would expect it to be increasing 2 weeks out from his hospital stay.  Please encourage him to use his iron 3 times a day.  We will need to order recheck of his hemoglobin in 2 weeks.  Thank you, Ellouise Newer, PA-C

## 2017-04-06 NOTE — Telephone Encounter (Signed)
The pt returned call and has been notified, he will take iron 3 times daily.  He will have repeat labs in 2 weeks.

## 2017-04-08 ENCOUNTER — Telehealth: Payer: Self-pay | Admitting: Gastroenterology

## 2017-04-08 MED ORDER — FERROUS SULFATE 325 (65 FE) MG PO TBEC: 325.0000 mg | DELAYED_RELEASE_TABLET | Freq: Three times a day (TID) | ORAL | 1 refills | Status: DC

## 2017-04-08 NOTE — Telephone Encounter (Signed)
Prescription sent to the pharmacy. Patient notified prescription was sent.

## 2017-05-27 ENCOUNTER — Encounter: Payer: Self-pay | Admitting: Gastroenterology

## 2017-05-27 ENCOUNTER — Ambulatory Visit (AMBULATORY_SURGERY_CENTER): Payer: Medicare Other | Admitting: Gastroenterology

## 2017-05-27 VITALS — BP 124/84 | HR 70 | Temp 97.3°F | Resp 14 | Ht 67.0 in | Wt 198.0 lb

## 2017-05-27 DIAGNOSIS — K250 Acute gastric ulcer with hemorrhage: Secondary | ICD-10-CM | POA: Diagnosis present

## 2017-05-27 DIAGNOSIS — K219 Gastro-esophageal reflux disease without esophagitis: Secondary | ICD-10-CM | POA: Diagnosis not present

## 2017-05-27 MED ORDER — SODIUM CHLORIDE 0.9 % IV SOLN: 500.0000 mL | Freq: Once | INTRAVENOUS | Status: DC

## 2017-05-27 MED ORDER — PANTOPRAZOLE SODIUM 40 MG PO TBEC: 40.0000 mg | DELAYED_RELEASE_TABLET | Freq: Every day | ORAL | 3 refills | Status: DC

## 2017-05-27 NOTE — Progress Notes (Signed)
Pt's states no medical or surgical changes since previsit or office visit. 

## 2017-05-27 NOTE — Progress Notes (Signed)
Pt showed me when I came into the recovery room bay that his tourniquet was left on his upper right arm.  I removed it.  There was no bruising noted.  However, there was an indention around his right upper arm that remained.  Pt was very nice and he siad it was "okay".  His IV was already removed while the tourniquet was still tight on his arm.  I looked under the bandage to make sure no bruising was at the IV site.  IV site looked normal.  maw

## 2017-05-27 NOTE — Patient Instructions (Addendum)
YOU HAD AN ENDOSCOPIC PROCEDURE TODAY AT [facility]:   Refer to the procedure report that was given to you for any specific questions about what was found during the examination.  If the procedure report does not answer your questions, please call your gastroenterologist to clarify.  If you requested that your care partner not be given the details of your procedure findings, then the procedure report has been included in a sealed envelope for you to review at your convenience later.  YOU SHOULD EXPECT: Some feelings of bloating in the abdomen. Passage of more gas than usual.  Walking can help get rid of the air that was put into your GI tract during the procedure and reduce the bloating. If you had a lower endoscopy (such as a colonoscopy or flexible sigmoidoscopy) you may notice spotting of blood in your stool or on the toilet paper. If you underwent a bowel prep for your procedure, you may not have a normal bowel movement for a few days.  Please Note:  You might notice some irritation and congestion in your nose or some drainage.  This is from the oxygen used during your procedure.  There is no need for concern and it should clear up in a day or so.  SYMPTOMS TO REPORT IMMEDIATELY:    Following upper endoscopy (EGD)  Vomiting of blood or coffee ground material  New chest pain or pain under the shoulder blades  Painful or persistently difficult swallowing  New shortness of breath  Fever of 100F or higher  Black, tarry-looking stools  For urgent or emergent issues, a gastroenterologist can be reached at any hour by calling [phone].   DIET:  We do recommend a small meal at first, but then you may proceed to your regular diet.  Drink plenty of fluids but you should avoid alcoholic beverages for 24 hours.  ACTIVITY:  You should plan to take it easy for the rest of today and you should NOT DRIVE or use heavy machinery until tomorrow (because of the sedation medicines used  during the test).    FOLLOW UP: Our staff will call the number listed on your records the next business day following your procedure to check on you and address any questions or concerns that you may have regarding the information given to you following your procedure. If we do not reach you, we will leave a message.  However, if you are feeling well and you are not experiencing any problems, there is no need to return our call.  We will assume that you have returned to your regular daily activities without incident.  If any biopsies were taken you will be contacted by phone or by letter within the next 1-3 weeks.  Please call us at [phone] if you have not heard about the biopsies in 3 weeks.    SIGNATURES/CONFIDENTIALITY: You and/or your care partner have signed paperwork which will be entered into your electronic medical record.  These signatures attest to the fact that that the information above on your After Visit Summary has been reviewed and is understood.  Full responsibility of the confidentiality of this discharge information lies with you and/or your care-partner.    Return to office in 1 year for follow up visit.  The office will call you with this appointment. Rx was sent to [facility] for Protonix.  Take once daily on an empty stomach 20-30 minutes prior to breakfast. You may resume your current medications today. Please call  if any questions or concerns.   

## 2017-05-27 NOTE — Progress Notes (Signed)
Report to PACU, RN, vss, BBS= Clear.  

## 2017-05-27 NOTE — Op Note (Signed)
Short Patient Name: Todd Caldwell Procedure Date: 05/27/2017 10:01 AM MRN: [id number] Endoscopist: Ladene Artist , MD Age: 67 Referring MD:  Date of Birth: 25-Aug-1950 Gender: Male Account #: [id number] Procedure:                Upper GI endoscopy Indications:              Follow-up of acute gastric ulcer with hemorrhage Medicines:                Monitored Anesthesia Care Procedure:                Pre-Anesthesia Assessment:                           - Prior to the procedure, a History and Physical                            was performed, and patient medications and                            allergies were reviewed. The patient's tolerance of                            previous anesthesia was also reviewed. The risks                            and benefits of the procedure and the sedation                            options and risks were discussed with the patient.                            All questions were answered, and informed consent                            was obtained. Prior Anticoagulants: The patient has                            taken no previous anticoagulant or antiplatelet                            agents. ASA Grade Assessment: II - A patient with                            mild systemic disease. After reviewing the risks                            and benefits, the patient was deemed in                            satisfactory condition to undergo the procedure.                           After obtaining informed consent, the endoscope was  passed under direct vision. Throughout the                            procedure, the patient's blood pressure, pulse, and                            oxygen saturations were monitored continuously. The                            Endoscope was introduced through the mouth, and                            advanced to the second part of duodenum. The upper                            GI  endoscopy was accomplished without difficulty.                            The patient tolerated the procedure well. Scope In: Scope Out: Findings:                 The examined esophagus was normal.                           The exam of the stomach was normal including                            retroflexed views. Prior ulcer had completely                            healed.                           The cardia and gastric fundus were normal on                            retroflexion.                           The duodenal bulb and second portion of the                            duodenum were normal. Complications:            No immediate complications. Estimated Blood Loss:     Estimated blood loss: none. Impression:               - Normal esophagus.                           - Normal stomach.                           - Normal duodenal bulb and second portion of the                            duodenum.                           -  No specimens collected. Recommendation:           - Patient has a contact number available for                            emergencies. The signs and symptoms of potential                            delayed complications were discussed with the                            patient. Return to normal activities tomorrow.                            Written discharge instructions were provided to the                            patient.                           - Resume previous diet.                           - Continue present medications.                           - Protonix 40 mg po qd, 1 year of refills.                           - Return to GI office in 1 year. Ladene Artist, MD 05/27/2017 10:16:53 AM This report has been signed electronically.

## 2017-05-27 NOTE — Progress Notes (Signed)
No further problems noted on discharge. maw

## 2017-05-30 ENCOUNTER — Telehealth: Payer: Self-pay | Admitting: *Deleted

## 2017-05-30 ENCOUNTER — Telehealth: Payer: Self-pay

## 2017-05-30 NOTE — Telephone Encounter (Signed)
Left message

## 2017-05-30 NOTE — Telephone Encounter (Signed)
  Follow up Call-  Call back number 05/27/2017  Post procedure Call Back phone  # [phone]  Permission to leave phone message Yes  Some recent data might be hidden     Patient questions:  Do you have a fever, pain , or abdominal swelling? No. Pain Score  0 *  Have you tolerated food without any problems? Yes.    Have you been able to return to your normal activities? Yes.    Do you have any questions about your discharge instructions: Diet   No. Medications  No. Follow up visit  No.  Do you have questions or concerns about your Care? No.  Actions: * If pain score is 4 or above: No action needed, pain <4.

## 2017-05-30 NOTE — Telephone Encounter (Deleted)
Left message  Follow up Call-  Call back number 05/27/2017  Post procedure Call Back phone  # [phone]  Permission to leave phone message Yes  Some recent data might be hidden     Patient questions:  Do you have a fever, pain , or abdominal swelling? {yes no:314532} Pain Score  {NUMBERS; 0-10:5044} *  Have you tolerated food without any problems? {yes no:314532}  Have you been able to return to your normal activities? {yes no:314532}  Do you have any questions about your discharge instructions: Diet   {yes no:314532} Medications  {yes no:314532} Follow up visit  {yes no:314532}  Do you have questions or concerns about your Care? {yes no:314532}  Actions: * If pain score is 4 or above: {ACTION; [facility] ENDO PAIN >4:21563::"No action needed, pain <4."}

## 2017-10-04 DIAGNOSIS — R82998 Other abnormal findings in urine: Secondary | ICD-10-CM | POA: Diagnosis not present

## 2017-10-04 DIAGNOSIS — D538 Other specified nutritional anemias: Secondary | ICD-10-CM | POA: Diagnosis not present

## 2017-10-04 DIAGNOSIS — Z125 Encounter for screening for malignant neoplasm of prostate: Secondary | ICD-10-CM | POA: Diagnosis not present

## 2017-10-04 DIAGNOSIS — E7849 Other hyperlipidemia: Secondary | ICD-10-CM | POA: Diagnosis not present

## 2017-10-04 DIAGNOSIS — D539 Nutritional anemia, unspecified: Secondary | ICD-10-CM | POA: Diagnosis not present

## 2017-10-04 DIAGNOSIS — R7301 Impaired fasting glucose: Secondary | ICD-10-CM | POA: Diagnosis not present

## 2017-10-10 DIAGNOSIS — Z1389 Encounter for screening for other disorder: Secondary | ICD-10-CM | POA: Diagnosis not present

## 2017-10-10 DIAGNOSIS — D538 Other specified nutritional anemias: Secondary | ICD-10-CM | POA: Diagnosis not present

## 2017-10-10 DIAGNOSIS — H9193 Unspecified hearing loss, bilateral: Secondary | ICD-10-CM | POA: Diagnosis not present

## 2017-10-10 DIAGNOSIS — R609 Edema, unspecified: Secondary | ICD-10-CM | POA: Diagnosis not present

## 2017-10-10 DIAGNOSIS — Z0000 Encounter for general adult medical examination without abnormal findings: Secondary | ICD-10-CM | POA: Diagnosis not present

## 2017-10-10 DIAGNOSIS — E7849 Other hyperlipidemia: Secondary | ICD-10-CM | POA: Diagnosis not present

## 2017-10-10 DIAGNOSIS — R7301 Impaired fasting glucose: Secondary | ICD-10-CM | POA: Diagnosis not present

## 2017-10-10 DIAGNOSIS — Z6832 Body mass index (BMI) 32.0-32.9, adult: Secondary | ICD-10-CM | POA: Diagnosis not present

## 2017-10-10 DIAGNOSIS — N183 Chronic kidney disease, stage 3 (moderate): Secondary | ICD-10-CM | POA: Diagnosis not present

## 2017-10-10 DIAGNOSIS — I517 Cardiomegaly: Secondary | ICD-10-CM | POA: Diagnosis not present

## 2017-10-10 DIAGNOSIS — R0609 Other forms of dyspnea: Secondary | ICD-10-CM | POA: Diagnosis not present

## 2017-10-10 DIAGNOSIS — R030 Elevated blood-pressure reading, without diagnosis of hypertension: Secondary | ICD-10-CM | POA: Diagnosis not present

## 2017-10-14 DIAGNOSIS — Z1212 Encounter for screening for malignant neoplasm of rectum: Secondary | ICD-10-CM | POA: Diagnosis not present

## 2017-11-01 DIAGNOSIS — Z961 Presence of intraocular lens: Secondary | ICD-10-CM | POA: Diagnosis not present

## 2018-03-10 DIAGNOSIS — Z23 Encounter for immunization: Secondary | ICD-10-CM | POA: Diagnosis not present

## 2018-05-25 ENCOUNTER — Other Ambulatory Visit: Payer: Self-pay | Admitting: Gastroenterology

## 2018-05-25 DIAGNOSIS — J181 Lobar pneumonia, unspecified organism: Secondary | ICD-10-CM | POA: Diagnosis not present

## 2018-05-25 DIAGNOSIS — K250 Acute gastric ulcer with hemorrhage: Secondary | ICD-10-CM

## 2018-05-25 DIAGNOSIS — D72829 Elevated white blood cell count, unspecified: Secondary | ICD-10-CM | POA: Diagnosis not present

## 2018-05-25 DIAGNOSIS — I10 Essential (primary) hypertension: Secondary | ICD-10-CM | POA: Diagnosis not present

## 2018-05-25 DIAGNOSIS — Z6832 Body mass index (BMI) 32.0-32.9, adult: Secondary | ICD-10-CM | POA: Diagnosis not present

## 2018-05-25 DIAGNOSIS — R1013 Epigastric pain: Secondary | ICD-10-CM | POA: Diagnosis not present

## 2018-05-25 DIAGNOSIS — R002 Palpitations: Secondary | ICD-10-CM | POA: Diagnosis not present

## 2018-06-01 DIAGNOSIS — J181 Lobar pneumonia, unspecified organism: Secondary | ICD-10-CM | POA: Diagnosis not present

## 2018-06-01 DIAGNOSIS — I10 Essential (primary) hypertension: Secondary | ICD-10-CM | POA: Diagnosis not present

## 2018-06-01 DIAGNOSIS — R002 Palpitations: Secondary | ICD-10-CM | POA: Diagnosis not present

## 2018-06-01 DIAGNOSIS — Z6832 Body mass index (BMI) 32.0-32.9, adult: Secondary | ICD-10-CM | POA: Diagnosis not present

## 2018-06-01 DIAGNOSIS — R1013 Epigastric pain: Secondary | ICD-10-CM | POA: Diagnosis not present

## 2018-06-01 DIAGNOSIS — D72829 Elevated white blood cell count, unspecified: Secondary | ICD-10-CM | POA: Diagnosis not present

## 2018-08-24 ENCOUNTER — Other Ambulatory Visit: Payer: Self-pay | Admitting: Gastroenterology

## 2018-08-24 DIAGNOSIS — K250 Acute gastric ulcer with hemorrhage: Secondary | ICD-10-CM

## 2018-09-04 ENCOUNTER — Other Ambulatory Visit: Payer: Self-pay

## 2018-09-05 ENCOUNTER — Encounter: Payer: Self-pay | Admitting: Gastroenterology

## 2018-09-05 ENCOUNTER — Ambulatory Visit (INDEPENDENT_AMBULATORY_CARE_PROVIDER_SITE_OTHER): Payer: Medicare Other | Admitting: Gastroenterology

## 2018-09-05 ENCOUNTER — Other Ambulatory Visit: Payer: Self-pay

## 2018-09-05 VITALS — Ht 67.0 in | Wt 186.0 lb

## 2018-09-05 DIAGNOSIS — Z8719 Personal history of other diseases of the digestive system: Secondary | ICD-10-CM | POA: Diagnosis not present

## 2018-09-05 DIAGNOSIS — Z1212 Encounter for screening for malignant neoplasm of rectum: Secondary | ICD-10-CM

## 2018-09-05 DIAGNOSIS — Z1211 Encounter for screening for malignant neoplasm of colon: Secondary | ICD-10-CM | POA: Diagnosis not present

## 2018-09-05 DIAGNOSIS — Z8711 Personal history of peptic ulcer disease: Secondary | ICD-10-CM

## 2018-09-05 DIAGNOSIS — R197 Diarrhea, unspecified: Secondary | ICD-10-CM | POA: Diagnosis not present

## 2018-09-05 NOTE — Progress Notes (Signed)
    History of Present Illness: This is a 68 year old male with a history of gastric ulcer with bleed in October 2018.  He has discontinued all aspirin and NSAID use.  He has had intermittent reflux symptoms in the past.  He notes a loose bowel movement each day and occasionally 2 loose bowel movements each day. When he temporarily discontinued Protonix he did not note these symptoms.  He has no other gastrointestinal complaints.  He states he has had colorectal cancer screening through Dr. Silvestre Mesi office with Cologuard testing.  Current Medications, Allergies, Past Medical History, Past Surgical History, Family History and Social History were reviewed in [employer] record.  Physical Exam: Telemedicine visit - not done   These services were provided via telemedicine, audio only.  The patient was at home and the provider was in the office, alone.  We discussed the limitations of evaluation and management by telemedicine and the availability of in person appointments.  Patient consented for this telemedicine visit and is aware of possible charges for this service.  The other person participating in the telemedicine service was Marlon Pel, [place] who reviewed medications, allergies, past history and completed AVS.  Time spent on call: 18 minutes Time spend on call, reviewing records, coordinating care and CMA portion: 27 minutes   Assessment and Recommendations:  1.  Mild diarrhea.  History of a gastric ulcer with bleed in 02/2017.  Suspected diarrhea side effect of Protonix.  Offered to the patient to switch to a different PPI however he feels worse his symptoms are minimal and he would like to remain on pantoprazole.  If his symptoms worsen he is advised to call and we will re-evaluate.  We discussed the long-term benefit to daily PPI usage and reducing the risk of another ulcer.  We discussed the long-term benefit to remaining off aspirin and NSAIDs as far as reducing the  risk of another ulcer.  2. CRC screening, average risk. Dr. Joylene Draft.  Patient wants to consider colonoscopy instead of Cologuard for colorectal cancer screening.  I addressed several of his questions.  Patient is advised that when his 3-year interval from his last Cologuard has arrived to consider colonoscopy instead.  He states he will discuss this with Dr. Joylene Draft at his scheduled appointment in June.   cc: Crist Infante, MD

## 2018-09-05 NOTE — Patient Instructions (Signed)
We have sent the following medications to your pharmacy for you to pick up at your convenience: pantoprazole.   Thank you for choosing me and  Gastroenterology.  Malcolm T. Stark, Jr., MD., FACG   

## 2018-09-14 ENCOUNTER — Other Ambulatory Visit: Payer: Self-pay | Admitting: Gastroenterology

## 2018-11-08 DIAGNOSIS — Z125 Encounter for screening for malignant neoplasm of prostate: Secondary | ICD-10-CM | POA: Diagnosis not present

## 2018-11-08 DIAGNOSIS — I10 Essential (primary) hypertension: Secondary | ICD-10-CM | POA: Diagnosis not present

## 2018-11-08 DIAGNOSIS — D539 Nutritional anemia, unspecified: Secondary | ICD-10-CM | POA: Diagnosis not present

## 2018-11-08 DIAGNOSIS — E785 Hyperlipidemia, unspecified: Secondary | ICD-10-CM | POA: Diagnosis not present

## 2018-11-08 DIAGNOSIS — R82998 Other abnormal findings in urine: Secondary | ICD-10-CM | POA: Diagnosis not present

## 2018-11-08 DIAGNOSIS — R7301 Impaired fasting glucose: Secondary | ICD-10-CM | POA: Diagnosis not present

## 2018-11-09 DIAGNOSIS — M20011 Mallet finger of right finger(s): Secondary | ICD-10-CM | POA: Diagnosis not present

## 2018-11-09 DIAGNOSIS — M66231 Spontaneous rupture of extensor tendons, right forearm: Secondary | ICD-10-CM | POA: Diagnosis not present

## 2018-11-09 DIAGNOSIS — S56409A Unspecified injury of extensor muscle, fascia and tendon of unspecified finger at forearm level, initial encounter: Secondary | ICD-10-CM | POA: Diagnosis not present

## 2018-11-09 DIAGNOSIS — M66241 Spontaneous rupture of extensor tendons, right hand: Secondary | ICD-10-CM | POA: Diagnosis not present

## 2018-11-15 DIAGNOSIS — Z0000 Encounter for general adult medical examination without abnormal findings: Secondary | ICD-10-CM | POA: Diagnosis not present

## 2018-11-15 DIAGNOSIS — E7849 Other hyperlipidemia: Secondary | ICD-10-CM | POA: Diagnosis not present

## 2018-11-15 DIAGNOSIS — N401 Enlarged prostate with lower urinary tract symptoms: Secondary | ICD-10-CM | POA: Diagnosis not present

## 2018-11-15 DIAGNOSIS — N183 Chronic kidney disease, stage 3 (moderate): Secondary | ICD-10-CM | POA: Diagnosis not present

## 2018-11-15 DIAGNOSIS — I129 Hypertensive chronic kidney disease with stage 1 through stage 4 chronic kidney disease, or unspecified chronic kidney disease: Secondary | ICD-10-CM | POA: Diagnosis not present

## 2018-11-15 DIAGNOSIS — E663 Overweight: Secondary | ICD-10-CM | POA: Diagnosis not present

## 2018-11-15 DIAGNOSIS — S56409A Unspecified injury of extensor muscle, fascia and tendon of unspecified finger at forearm level, initial encounter: Secondary | ICD-10-CM | POA: Diagnosis not present

## 2018-11-15 DIAGNOSIS — Z1331 Encounter for screening for depression: Secondary | ICD-10-CM | POA: Diagnosis not present

## 2018-11-15 DIAGNOSIS — I517 Cardiomegaly: Secondary | ICD-10-CM | POA: Diagnosis not present

## 2018-11-15 DIAGNOSIS — K279 Peptic ulcer, site unspecified, unspecified as acute or chronic, without hemorrhage or perforation: Secondary | ICD-10-CM | POA: Diagnosis not present

## 2018-11-15 DIAGNOSIS — D539 Nutritional anemia, unspecified: Secondary | ICD-10-CM | POA: Diagnosis not present

## 2018-11-15 DIAGNOSIS — R609 Edema, unspecified: Secondary | ICD-10-CM | POA: Diagnosis not present

## 2018-11-15 DIAGNOSIS — Z1339 Encounter for screening examination for other mental health and behavioral disorders: Secondary | ICD-10-CM | POA: Diagnosis not present

## 2018-11-20 ENCOUNTER — Other Ambulatory Visit: Payer: Self-pay

## 2018-11-20 ENCOUNTER — Other Ambulatory Visit: Payer: Self-pay | Admitting: Internal Medicine

## 2018-11-20 DIAGNOSIS — Y999 Unspecified external cause status: Secondary | ICD-10-CM | POA: Diagnosis not present

## 2018-11-20 DIAGNOSIS — E785 Hyperlipidemia, unspecified: Secondary | ICD-10-CM

## 2018-11-20 DIAGNOSIS — M67931 Unspecified disorder of synovium and tendon, right forearm: Secondary | ICD-10-CM | POA: Diagnosis not present

## 2018-11-20 DIAGNOSIS — X58XXXA Exposure to other specified factors, initial encounter: Secondary | ICD-10-CM | POA: Diagnosis not present

## 2018-11-20 DIAGNOSIS — M66241 Spontaneous rupture of extensor tendons, right hand: Secondary | ICD-10-CM | POA: Diagnosis not present

## 2018-11-20 DIAGNOSIS — M65841 Other synovitis and tenosynovitis, right hand: Secondary | ICD-10-CM | POA: Diagnosis not present

## 2018-11-20 DIAGNOSIS — S66390A Other injury of extensor muscle, fascia and tendon of right index finger at wrist and hand level, initial encounter: Secondary | ICD-10-CM | POA: Diagnosis not present

## 2018-11-23 DIAGNOSIS — M25641 Stiffness of right hand, not elsewhere classified: Secondary | ICD-10-CM | POA: Diagnosis not present

## 2018-11-23 DIAGNOSIS — M66231 Spontaneous rupture of extensor tendons, right forearm: Secondary | ICD-10-CM | POA: Diagnosis not present

## 2018-11-23 DIAGNOSIS — M79644 Pain in right finger(s): Secondary | ICD-10-CM | POA: Diagnosis not present

## 2018-11-23 DIAGNOSIS — M66241 Spontaneous rupture of extensor tendons, right hand: Secondary | ICD-10-CM | POA: Diagnosis not present

## 2018-12-11 ENCOUNTER — Telehealth: Payer: Self-pay | Admitting: Gastroenterology

## 2018-12-11 ENCOUNTER — Other Ambulatory Visit: Payer: Self-pay | Admitting: Gastroenterology

## 2018-12-11 MED ORDER — ESOMEPRAZOLE MAGNESIUM 40 MG PO CPDR: 40.0000 mg | DELAYED_RELEASE_CAPSULE | Freq: Every morning | ORAL | 1 refills | Status: DC

## 2018-12-11 NOTE — Telephone Encounter (Signed)
Informed patient that I saw he has been having these issues with pantoprazole according to Dr. Lynne Leader last office note. Patient states it was such tolerable but now having issues with hemorrhoids due to the diarrhea side effect. Patient states he would like to switch his acid medication now. Informed patient to discontinue pantoprazole and that I will send esomeprazole to the pharmacy in place of the pantoprazole. Patient verbalized understanding.

## 2018-12-11 NOTE — Telephone Encounter (Signed)
Pt reported that pantoprazole is causing him to have loose stools and develop a hemorrhoid.  Pt would like a call back to discuss.

## 2019-01-01 ENCOUNTER — Ambulatory Visit
Admission: RE | Admit: 2019-01-01 | Discharge: 2019-01-01 | Disposition: A | Discharge status: Home / Self Care | Payer: No Typology Code available for payment source | Source: Ambulatory Visit | Attending: Internal Medicine | Admitting: Internal Medicine

## 2019-01-01 DIAGNOSIS — E785 Hyperlipidemia, unspecified: Secondary | ICD-10-CM

## 2019-01-04 ENCOUNTER — Other Ambulatory Visit: Payer: Self-pay | Admitting: Internal Medicine

## 2019-01-04 DIAGNOSIS — R911 Solitary pulmonary nodule: Secondary | ICD-10-CM

## 2019-01-11 DIAGNOSIS — M79644 Pain in right finger(s): Secondary | ICD-10-CM | POA: Diagnosis not present

## 2019-01-11 DIAGNOSIS — M66241 Spontaneous rupture of extensor tendons, right hand: Secondary | ICD-10-CM | POA: Diagnosis not present

## 2019-01-11 DIAGNOSIS — M66231 Spontaneous rupture of extensor tendons, right forearm: Secondary | ICD-10-CM | POA: Diagnosis not present

## 2019-01-11 DIAGNOSIS — M25641 Stiffness of right hand, not elsewhere classified: Secondary | ICD-10-CM | POA: Diagnosis not present

## 2019-01-18 DIAGNOSIS — M66241 Spontaneous rupture of extensor tendons, right hand: Secondary | ICD-10-CM | POA: Diagnosis not present

## 2019-01-18 DIAGNOSIS — M79644 Pain in right finger(s): Secondary | ICD-10-CM | POA: Diagnosis not present

## 2019-01-18 DIAGNOSIS — M25641 Stiffness of right hand, not elsewhere classified: Secondary | ICD-10-CM | POA: Diagnosis not present

## 2019-01-18 DIAGNOSIS — M66231 Spontaneous rupture of extensor tendons, right forearm: Secondary | ICD-10-CM | POA: Diagnosis not present

## 2019-01-30 DIAGNOSIS — M66241 Spontaneous rupture of extensor tendons, right hand: Secondary | ICD-10-CM | POA: Diagnosis not present

## 2019-01-30 DIAGNOSIS — M25641 Stiffness of right hand, not elsewhere classified: Secondary | ICD-10-CM | POA: Diagnosis not present

## 2019-01-30 DIAGNOSIS — M66231 Spontaneous rupture of extensor tendons, right forearm: Secondary | ICD-10-CM | POA: Diagnosis not present

## 2019-01-30 DIAGNOSIS — M79644 Pain in right finger(s): Secondary | ICD-10-CM | POA: Diagnosis not present

## 2019-02-01 DIAGNOSIS — M65331 Trigger finger, right middle finger: Secondary | ICD-10-CM | POA: Diagnosis not present

## 2019-03-06 DIAGNOSIS — M66241 Spontaneous rupture of extensor tendons, right hand: Secondary | ICD-10-CM | POA: Diagnosis not present

## 2019-03-06 DIAGNOSIS — M66231 Spontaneous rupture of extensor tendons, right forearm: Secondary | ICD-10-CM | POA: Diagnosis not present

## 2019-03-06 DIAGNOSIS — M20011 Mallet finger of right finger(s): Secondary | ICD-10-CM | POA: Diagnosis not present

## 2019-03-06 DIAGNOSIS — M65331 Trigger finger, right middle finger: Secondary | ICD-10-CM | POA: Diagnosis not present

## 2019-03-11 ENCOUNTER — Other Ambulatory Visit: Payer: Self-pay | Admitting: Gastroenterology

## 2019-06-03 ENCOUNTER — Other Ambulatory Visit: Payer: Self-pay | Admitting: Gastroenterology

## 2019-06-04 ENCOUNTER — Other Ambulatory Visit: Payer: Self-pay

## 2019-07-03 ENCOUNTER — Ambulatory Visit
Admission: RE | Admit: 2019-07-03 | Discharge: 2019-07-03 | Disposition: A | Discharge status: Home / Self Care | Payer: Medicare Other | Source: Ambulatory Visit | Attending: Internal Medicine | Admitting: Internal Medicine

## 2019-07-03 DIAGNOSIS — R918 Other nonspecific abnormal finding of lung field: Secondary | ICD-10-CM | POA: Diagnosis not present

## 2019-07-03 DIAGNOSIS — R911 Solitary pulmonary nodule: Secondary | ICD-10-CM

## 2019-09-02 ENCOUNTER — Other Ambulatory Visit: Payer: Self-pay | Admitting: Gastroenterology

## 2019-12-25 ENCOUNTER — Other Ambulatory Visit: Payer: Self-pay | Admitting: Gastroenterology

## 2019-12-27 ENCOUNTER — Telehealth: Payer: Self-pay | Admitting: Gastroenterology

## 2019-12-27 ENCOUNTER — Other Ambulatory Visit: Payer: Self-pay | Admitting: Gastroenterology

## 2019-12-27 DIAGNOSIS — E7849 Other hyperlipidemia: Secondary | ICD-10-CM | POA: Diagnosis not present

## 2019-12-27 DIAGNOSIS — R7301 Impaired fasting glucose: Secondary | ICD-10-CM | POA: Diagnosis not present

## 2019-12-27 NOTE — Telephone Encounter (Signed)
Prescription sent to patient's pharmacy.

## 2019-12-27 NOTE — Telephone Encounter (Signed)
Pt called requesting rf for Esomeprazole. Pt is scheduled for his colonoscopy on 8/25 with Dr. Fuller Plan. Pt is going out of town on Friday so would like his rf before that.

## 2020-01-10 ENCOUNTER — Ambulatory Visit (AMBULATORY_SURGERY_CENTER): Payer: Self-pay | Admitting: *Deleted

## 2020-01-10 ENCOUNTER — Other Ambulatory Visit: Payer: Self-pay

## 2020-01-10 VITALS — Ht 67.0 in | Wt 195.0 lb

## 2020-01-10 DIAGNOSIS — I2510 Atherosclerotic heart disease of native coronary artery without angina pectoris: Secondary | ICD-10-CM | POA: Diagnosis not present

## 2020-01-10 DIAGNOSIS — K219 Gastro-esophageal reflux disease without esophagitis: Secondary | ICD-10-CM | POA: Diagnosis not present

## 2020-01-10 DIAGNOSIS — E663 Overweight: Secondary | ICD-10-CM | POA: Diagnosis not present

## 2020-01-10 DIAGNOSIS — I517 Cardiomegaly: Secondary | ICD-10-CM | POA: Diagnosis not present

## 2020-01-10 DIAGNOSIS — R002 Palpitations: Secondary | ICD-10-CM | POA: Diagnosis not present

## 2020-01-10 DIAGNOSIS — Z1211 Encounter for screening for malignant neoplasm of colon: Secondary | ICD-10-CM

## 2020-01-10 DIAGNOSIS — R609 Edema, unspecified: Secondary | ICD-10-CM | POA: Diagnosis not present

## 2020-01-10 DIAGNOSIS — R030 Elevated blood-pressure reading, without diagnosis of hypertension: Secondary | ICD-10-CM | POA: Diagnosis not present

## 2020-01-10 DIAGNOSIS — R82998 Other abnormal findings in urine: Secondary | ICD-10-CM | POA: Diagnosis not present

## 2020-01-10 DIAGNOSIS — I998 Other disorder of circulatory system: Secondary | ICD-10-CM | POA: Diagnosis not present

## 2020-01-10 DIAGNOSIS — H9190 Unspecified hearing loss, unspecified ear: Secondary | ICD-10-CM | POA: Diagnosis not present

## 2020-01-10 DIAGNOSIS — Z0000 Encounter for general adult medical examination without abnormal findings: Secondary | ICD-10-CM | POA: Diagnosis not present

## 2020-01-10 DIAGNOSIS — I10 Essential (primary) hypertension: Secondary | ICD-10-CM | POA: Diagnosis not present

## 2020-01-10 DIAGNOSIS — E785 Hyperlipidemia, unspecified: Secondary | ICD-10-CM | POA: Diagnosis not present

## 2020-01-10 MED ORDER — PEG 3350-KCL-NA BICARB-NACL 420 G PO SOLR: 4000.0000 mL | Freq: Once | ORAL | 0 refills | Status: AC

## 2020-01-10 NOTE — Progress Notes (Signed)

## 2020-01-16 ENCOUNTER — Other Ambulatory Visit: Payer: Self-pay

## 2020-01-16 ENCOUNTER — Ambulatory Visit (AMBULATORY_SURGERY_CENTER): Payer: Medicare Other | Admitting: Gastroenterology

## 2020-01-16 ENCOUNTER — Encounter: Payer: Self-pay | Admitting: Gastroenterology

## 2020-01-16 VITALS — BP 147/91 | HR 68 | Temp 98.2°F | Resp 21 | Ht 67.0 in | Wt 195.0 lb

## 2020-01-16 DIAGNOSIS — Z1211 Encounter for screening for malignant neoplasm of colon: Secondary | ICD-10-CM | POA: Diagnosis not present

## 2020-01-16 DIAGNOSIS — K219 Gastro-esophageal reflux disease without esophagitis: Secondary | ICD-10-CM | POA: Diagnosis not present

## 2020-01-16 DIAGNOSIS — K635 Polyp of colon: Secondary | ICD-10-CM

## 2020-01-16 DIAGNOSIS — D124 Benign neoplasm of descending colon: Secondary | ICD-10-CM

## 2020-01-16 DIAGNOSIS — I10 Essential (primary) hypertension: Secondary | ICD-10-CM | POA: Diagnosis not present

## 2020-01-16 MED ORDER — SODIUM CHLORIDE 0.9 % IV SOLN: 500.0000 mL | INTRAVENOUS | Status: DC

## 2020-01-16 NOTE — Progress Notes (Signed)
VS- Todd Caldwell  Pt's states no medical or surgical changes since previsit or office visit.  Called to room to assist during endoscopic procedure.  Patient ID and intended procedure confirmed with present staff. Received instructions for my participation in the procedure from the performing physician.

## 2020-01-16 NOTE — Op Note (Signed)
[facility] Patient Name: Todd Caldwell Procedure Date: 01/16/2020 7:59 AM MRN: [id number] Endoscopist: Ladene Artist , MD Age: 69 Referring MD:  Date of Birth: 22-Sep-1950 Gender: Male Account #: [id number] Procedure:                Colonoscopy Indications:              Screening for colorectal malignant neoplasm Medicines:                Monitored Anesthesia Care Procedure:                Pre-Anesthesia Assessment:                           - Prior to the procedure, a History and Physical                            was performed, and patient medications and                            allergies were reviewed. The patient's tolerance of                            previous anesthesia was also reviewed. The risks                            and benefits of the procedure and the sedation                            options and risks were discussed with the patient.                            All questions were answered, and informed consent                            was obtained. Prior Anticoagulants: The patient has                            taken no previous anticoagulant or antiplatelet                            agents. ASA Grade Assessment: II - A patient with                            mild systemic disease. After reviewing the risks                            and benefits, the patient was deemed in                            satisfactory condition to undergo the procedure.                           After obtaining informed consent, the colonoscope  was passed under direct vision. Throughout the                            procedure, the patient's blood pressure, pulse, and                            oxygen saturations were monitored continuously. The                            Colonoscope was introduced through the anus and                            advanced to the the cecum, identified by                            appendiceal orifice and  ileocecal valve. The                            ileocecal valve, appendiceal orifice, and rectum                            were photographed. The quality of the bowel                            preparation was good. The colonoscopy was performed                            without difficulty. The patient tolerated the                            procedure well. Scope In: 8:08:37 AM Scope Out: 8:22:53 AM Scope Withdrawal Time: 0 hours 12 minutes 16 seconds  Total Procedure Duration: 0 hours 14 minutes 16 seconds  Findings:                 The perianal and digital rectal examinations were                            normal.                           A 6 mm polyp was found in the descending colon. The                            polyp was sessile. The polyp was removed with a                            cold snare. Resection and retrieval were complete.                           Multiple medium-mouthed diverticula were found in                            the left colon. There was no evidence of  diverticular bleeding.                           The exam was otherwise without abnormality on                            direct and retroflexion views. Complications:            No immediate complications. Estimated blood loss:                            None. Estimated Blood Loss:     Estimated blood loss: none. Impression:               - One 6 mm polyp in the transverse colon, removed                            with a cold snare. Resected and retrieved.                           - Mild diverticulosis in the left colon.                           - The examination was otherwise normal on direct                            and retroflexion views. Recommendation:           - Repeat colonoscopy after studies are complete for                            surveillance based on pathology results.                           - Patient has a contact number available for                             emergencies. The signs and symptoms of potential                            delayed complications were discussed with the                            patient. Return to normal activities tomorrow.                            Written discharge instructions were provided to the                            patient.                           - Resume previous diet.                           - Continue present medications.                           -  Await pathology results. Ladene Artist, MD 01/16/2020 8:30:21 AM This report has been signed electronically.

## 2020-01-16 NOTE — Progress Notes (Signed)
Report to PACU, RN, vss, BBS= Clear.  

## 2020-01-16 NOTE — Patient Instructions (Signed)
Read all of the handouts given to you by your recovery room nurse.  Thank-you for choosing [place] for your healthcare needs today.  YOU HAD AN ENDOSCOPIC PROCEDURE TODAY AT [facility]:   Refer to the procedure report that was given to you for any specific questions about what was found during the examination.  If the procedure report does not answer your questions, please call your gastroenterologist to clarify.  If you requested that your care partner not be given the details of your procedure findings, then the procedure report has been included in a sealed envelope for you to review at your convenience later.  YOU SHOULD EXPECT: Some feelings of bloating in the abdomen. Passage of more gas than usual.  Walking can help get rid of the air that was put into your GI tract during the procedure and reduce the bloating. If you had a lower endoscopy (such as a colonoscopy or flexible sigmoidoscopy) you may notice spotting of blood in your stool or on the toilet paper. If you underwent a bowel prep for your procedure, you may not have a normal bowel movement for a few days.  Please Note:  You might notice some irritation and congestion in your nose or some drainage.  This is from the oxygen used during your procedure.  There is no need for concern and it should clear up in a day or so.  SYMPTOMS TO REPORT IMMEDIATELY:   Following lower endoscopy (colonoscopy or flexible sigmoidoscopy):  Excessive amounts of blood in the stool  Significant tenderness or worsening of abdominal pains  Swelling of the abdomen that is new, acute  Fever of 100F or higher   For urgent or emergent issues, a gastroenterologist can be reached at any hour by calling [phone]. Do not use MyChart messaging for urgent concerns.    DIET:  We do recommend a small meal at first, but then you may proceed to your regular diet.  Drink plenty of fluids but you should avoid alcoholic beverages for 24 hours. Try to  increse the fiber in your diet, and drink plenty of water. ACTIVITY:  You should plan to take it easy for the rest of today and you should NOT DRIVE or use heavy machinery until tomorrow (because of the sedation medicines used during the test).    FOLLOW UP: Our staff will call the number listed on your records 48-72 hours following your procedure to check on you and address any questions or concerns that you may have regarding the information given to you following your procedure. If we do not reach you, we will leave a message.  We will attempt to reach you two times.  During this call, we will ask if you have developed any symptoms of COVID 19. If you develop any symptoms (ie: fever, flu-like symptoms, shortness of breath, cough etc.) before then, please call [phone].  If you test positive for Covid 19 in the 2 weeks post procedure, please call and report this information to [place].    If any biopsies were taken you will be contacted by phone or by letter within the next 1-3 weeks.  Please call us at [phone] if you have not heard about the biopsies in 3 weeks.    SIGNATURES/CONFIDENTIALITY: You and/or your care partner have signed paperwork which will be entered into your electronic medical record.  These signatures attest to the fact that that the information above on your After Visit Summary has been reviewed and  is understood.  Full responsibility of the confidentiality of this discharge information lies with you and/or your care-partner.

## 2020-01-18 ENCOUNTER — Telehealth: Payer: Self-pay | Admitting: *Deleted

## 2020-01-18 ENCOUNTER — Telehealth: Payer: Self-pay

## 2020-01-18 NOTE — Telephone Encounter (Signed)
  Follow up Call-  Call back number 01/16/2020 05/27/2017  Post procedure Call Back phone  # [phone]  Permission to leave phone message Yes Yes  Some recent data might be hidden     Patient questions:  Do you have a fever, pain , or abdominal swelling? No. Pain Score  0 *  Have you tolerated food without any problems? Yes.    Have you been able to return to your normal activities? Yes.    Do you have any questions about your discharge instructions: Diet   No. Medications  No. Follow up visit  No.  Do you have questions or concerns about your Care? No.  Actions: * If pain score is 4 or above: No action needed, pain <4.  1. Have you developed a fever since your procedure? no  2.   Have you had an respiratory symptoms (SOB or cough) since your procedure? no  3.   Have you tested positive for COVID 19 since your procedure no  4.   Have you had any family members/close contacts diagnosed with the COVID 19 since your procedure? no   If yes to any of these questions please route to Joylene John, RN and Joella Prince, RN

## 2020-01-18 NOTE — Telephone Encounter (Signed)
First post procedure follow up call, no answer 

## 2020-01-24 ENCOUNTER — Encounter: Payer: Self-pay | Admitting: Gastroenterology

## 2020-02-04 DIAGNOSIS — Z23 Encounter for immunization: Secondary | ICD-10-CM | POA: Diagnosis not present

## 2020-03-29 ENCOUNTER — Other Ambulatory Visit: Payer: Self-pay | Admitting: Gastroenterology

## 2020-04-07 DIAGNOSIS — Z23 Encounter for immunization: Secondary | ICD-10-CM | POA: Diagnosis not present

## 2020-07-03 DIAGNOSIS — M25552 Pain in left hip: Secondary | ICD-10-CM | POA: Diagnosis not present

## 2020-07-03 DIAGNOSIS — M5459 Other low back pain: Secondary | ICD-10-CM | POA: Diagnosis not present

## 2020-07-28 ENCOUNTER — Other Ambulatory Visit: Payer: Self-pay | Admitting: Gastroenterology

## 2020-09-15 DIAGNOSIS — R059 Cough, unspecified: Secondary | ICD-10-CM | POA: Diagnosis not present

## 2020-09-15 DIAGNOSIS — N1831 Chronic kidney disease, stage 3a: Secondary | ICD-10-CM | POA: Diagnosis not present

## 2020-09-15 DIAGNOSIS — J189 Pneumonia, unspecified organism: Secondary | ICD-10-CM | POA: Diagnosis not present

## 2020-09-15 DIAGNOSIS — I129 Hypertensive chronic kidney disease with stage 1 through stage 4 chronic kidney disease, or unspecified chronic kidney disease: Secondary | ICD-10-CM | POA: Diagnosis not present

## 2020-10-22 ENCOUNTER — Other Ambulatory Visit: Payer: Self-pay | Admitting: Gastroenterology

## 2020-10-27 DIAGNOSIS — J189 Pneumonia, unspecified organism: Secondary | ICD-10-CM | POA: Diagnosis not present

## 2020-11-11 DIAGNOSIS — M65331 Trigger finger, right middle finger: Secondary | ICD-10-CM | POA: Diagnosis not present

## 2020-11-11 DIAGNOSIS — M7989 Other specified soft tissue disorders: Secondary | ICD-10-CM | POA: Diagnosis not present

## 2020-12-29 DIAGNOSIS — Z961 Presence of intraocular lens: Secondary | ICD-10-CM | POA: Diagnosis not present

## 2021-02-01 IMAGING — CT CT HEART SCORING
3 series · 12 of 20 positions shown, 14 images · non-contrast
Comparison: Chest radiograph 01/31/2000, report only. CTA of the
abdomen 03/20/2017.

CLINICAL DATA: Hyperlipidemia. Hypertension.

EXAM:
CT HEART FOR CALCIUM SCORING
TECHNIQUE: CT heart was performed using prospective ECG gating.
A non-contrast exam for calcium scoring was performed.
Note that this exam targets the heart and the chest was not imaged
in its entirety.

[Series 2: calcium scoring 2.00 qr36 bestdiast 69% · axial · 0.46mm/px · z∈[+1567,+1603]mm · 2 of 90 slices shown]
[im 18/90  vessel]
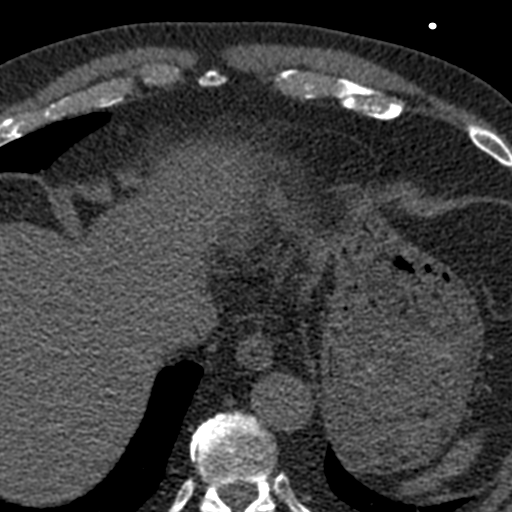
[im 36/90  vessel]
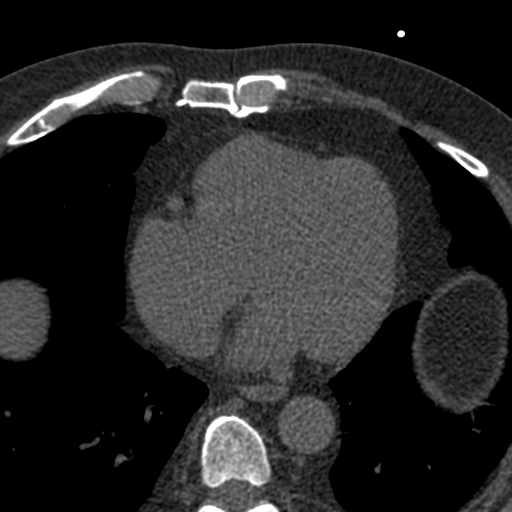

[Series 3: calcium scoring 2.00 br40 bestdiast 69% fov · axial · 0.58mm/px · z∈[+1567,+1681]mm · 5 of 87 slices shown, 7 images]
[im 15/87  vessel]
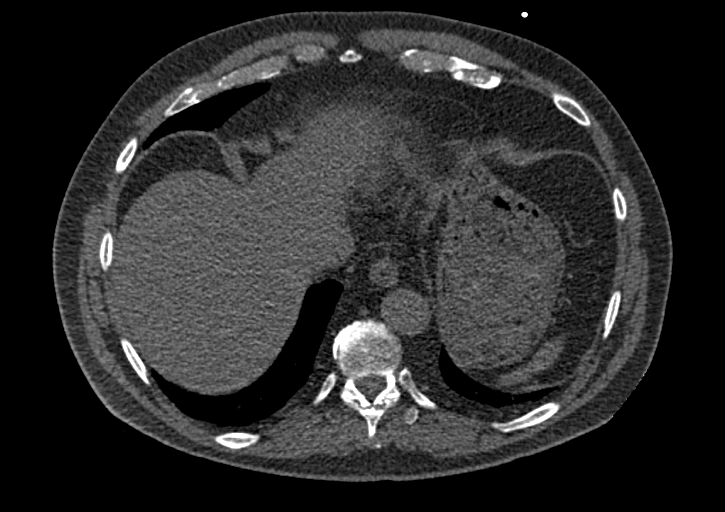
[im 15/87  lung]
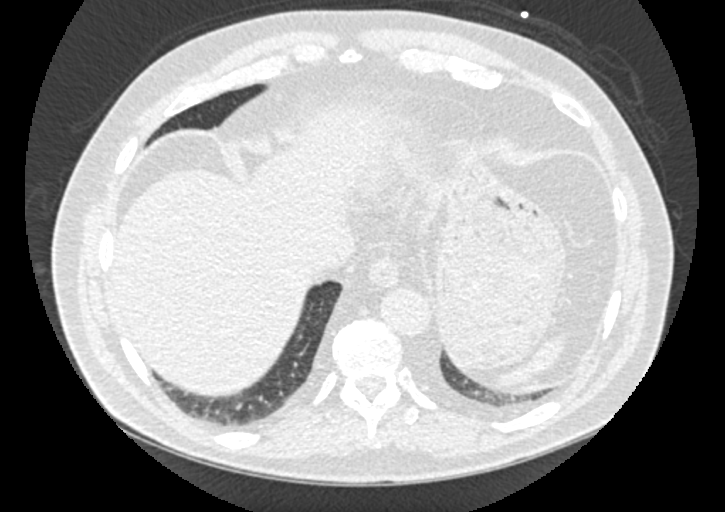
[im 29/87  vessel]
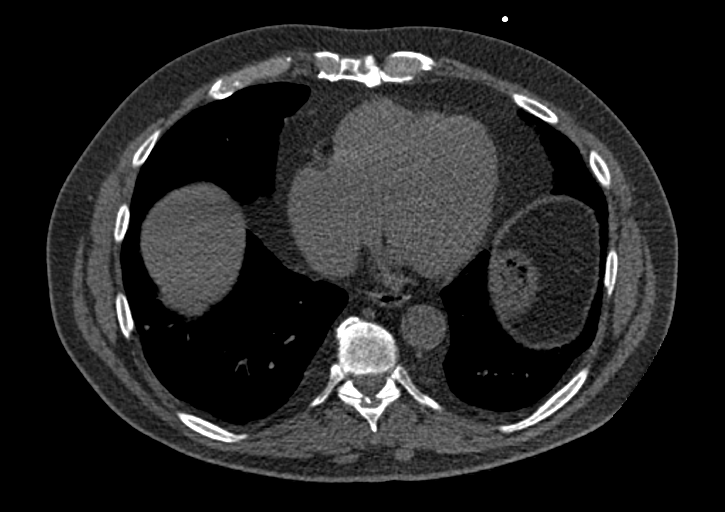
[im 44/87  vessel]
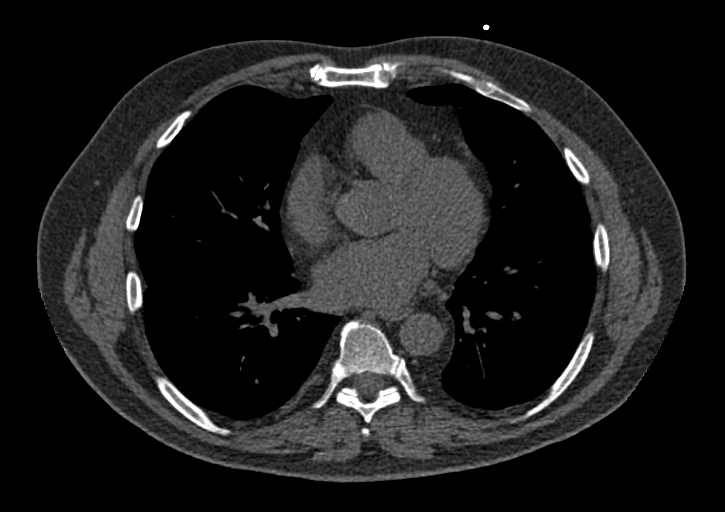
[im 58/87  vessel]
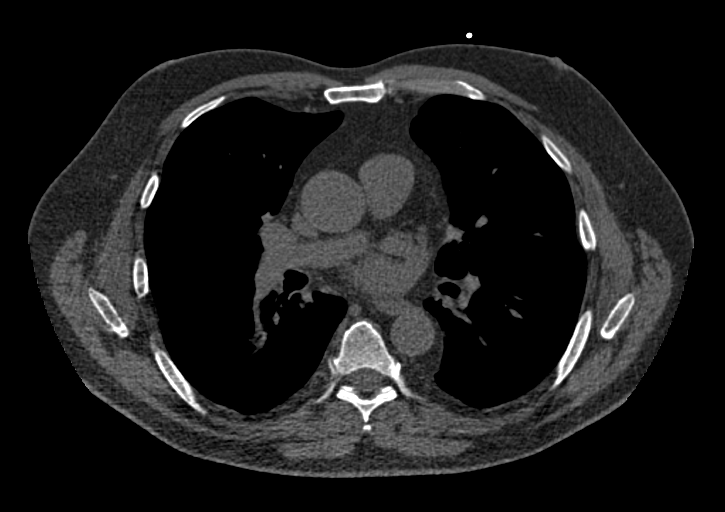
[im 72/87  vessel]
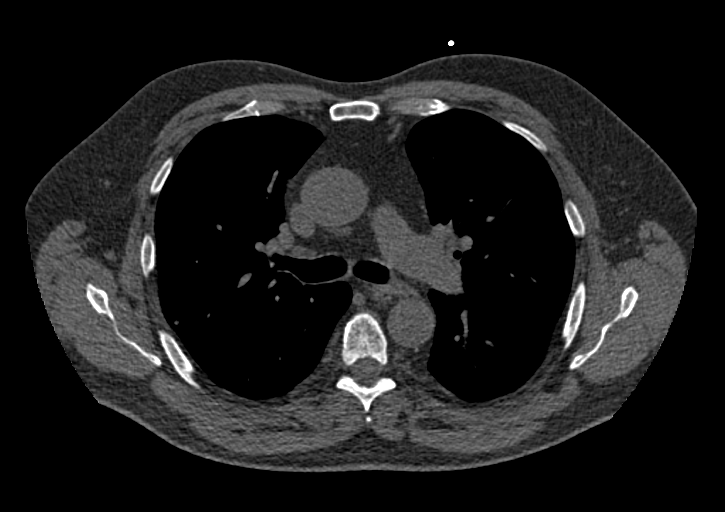
[im 72/87  lung]
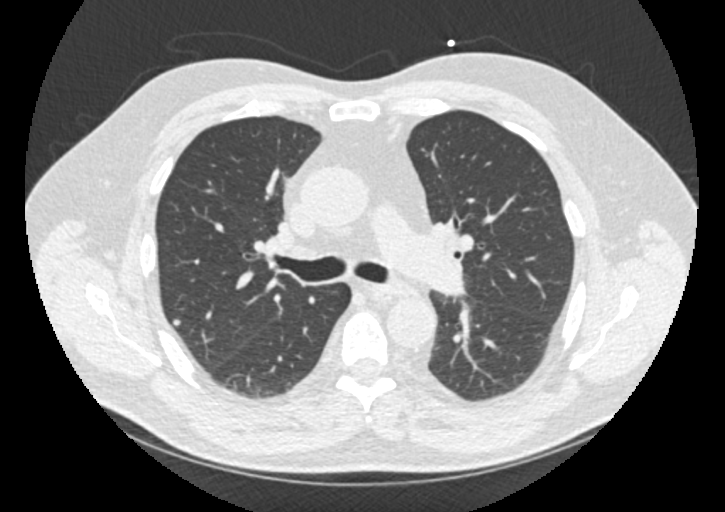

[Series 9: calcium scoring 2.00 br60 bestdiast 69% fov · axial · 0.58mm/px · z∈[+1567,+1681]mm · 5 of 87 slices shown]
[im 15/87  vessel]
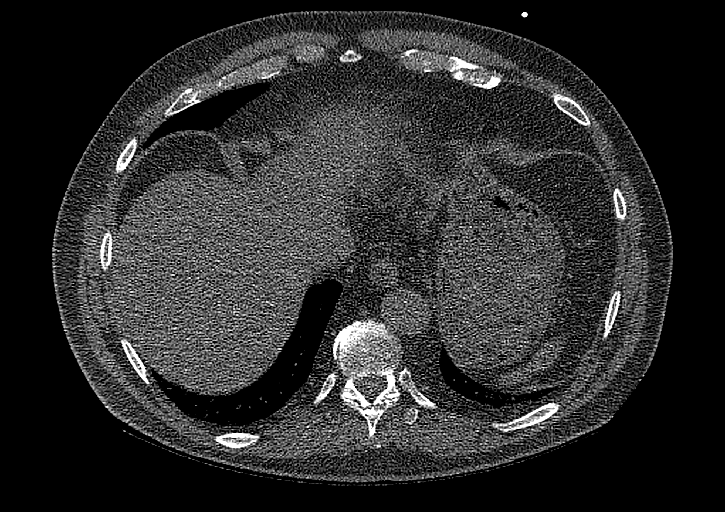
[im 29/87  vessel]
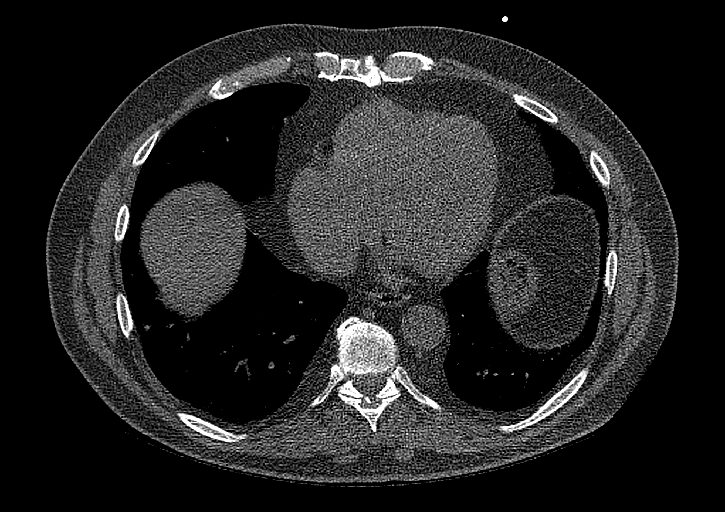
[im 44/87  vessel]
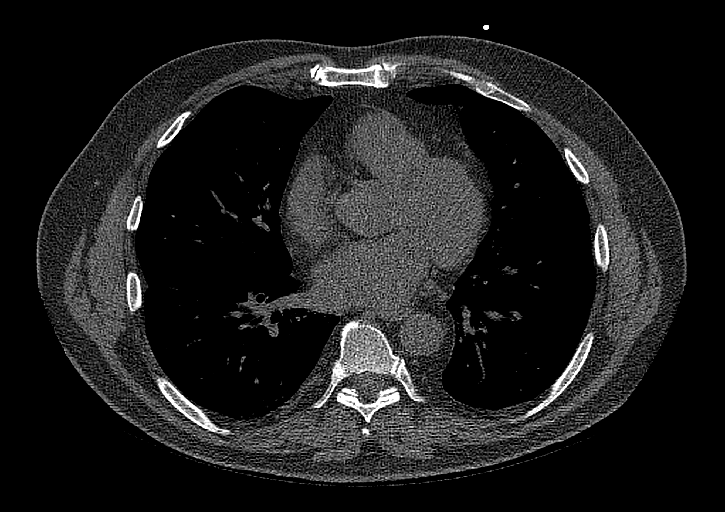
[im 58/87  vessel]
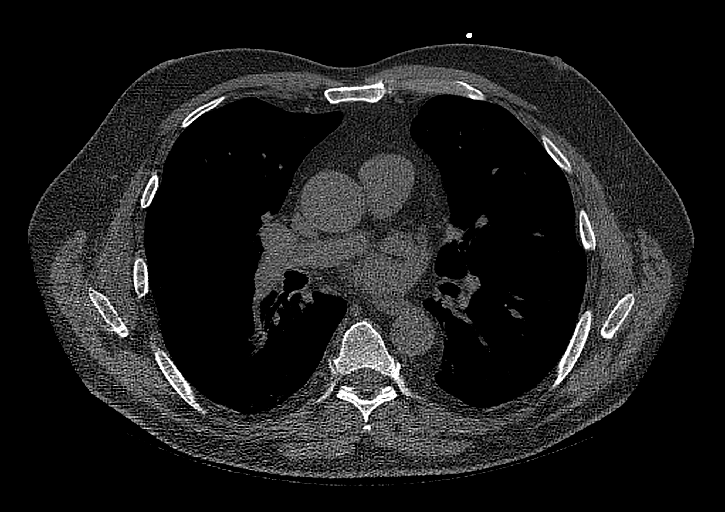
[im 72/87  vessel]
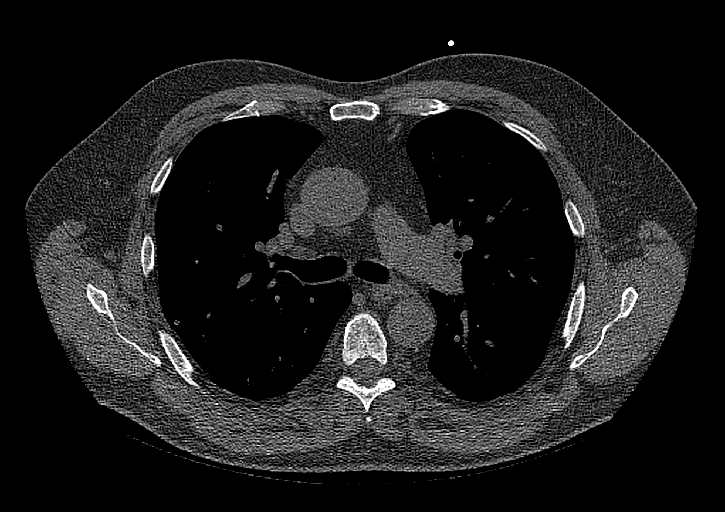

[12 of 20 positions shown; findings below may reference images not displayed]

FINDINGS: Technical quality: Good.

CORONARY CALCIUM

Total Agatston Score: 58.1 -3 vessel coronary calcification, most
significant in the proximal LAD and right coronary artery on series
2.

[HOSPITAL] percentile:  37th

OTHER FINDINGS:

Cardiovascular: Normal caliber of the aorta and branch vessels.
Normal heart size, without pericardial effusion.

Mediastinum/Nodes: No mediastinal or definite hilar adenopathy,
given limitations of unenhanced CT.

Lungs/Pleura: No imaged pleural fluid. Suspect mild centrilobular
emphysema. Bilateral pulmonary nodules. The largest measures 7 mm in
the right middle lobe on 58/9 and the right lower lobe on 65/9. The
right lower lobe nodules felt to be similar to 03/20/2017. The right
middle lobe nodule may have enlarged.

Upper Abdomen: Normal imaged portions of the liver, spleen, stomach.

Musculoskeletal: No acute osseous abnormality.
IMPRESSION: 1. Total Agatston score of 58.1, corresponding to 37th percentile
for age, sex, and race based cohort.
2. Bilateral pulmonary nodules, including a right middle lobe nodule
which may have enlarged since 03/20/2017. Non-contrast chest CT at
6-12 months is recommended. If the nodule is stable at time of
repeat CT, then future CT at 18-24 months (from today's scan) is
considered optional for low-risk patients, but is recommended for
high-risk patients. This recommendation follows the consensus
statement: Guidelines for Management of Incidental Pulmonary Nodules
Detected on CT Images: From the [HOSPITAL] 9401; Radiology

## 2021-02-19 DIAGNOSIS — E785 Hyperlipidemia, unspecified: Secondary | ICD-10-CM | POA: Diagnosis not present

## 2021-02-19 DIAGNOSIS — Z125 Encounter for screening for malignant neoplasm of prostate: Secondary | ICD-10-CM | POA: Diagnosis not present

## 2021-02-19 DIAGNOSIS — R7301 Impaired fasting glucose: Secondary | ICD-10-CM | POA: Diagnosis not present

## 2021-02-26 DIAGNOSIS — R7301 Impaired fasting glucose: Secondary | ICD-10-CM | POA: Diagnosis not present

## 2021-02-26 DIAGNOSIS — N1831 Chronic kidney disease, stage 3a: Secondary | ICD-10-CM | POA: Diagnosis not present

## 2021-02-26 DIAGNOSIS — I129 Hypertensive chronic kidney disease with stage 1 through stage 4 chronic kidney disease, or unspecified chronic kidney disease: Secondary | ICD-10-CM | POA: Diagnosis not present

## 2021-02-26 DIAGNOSIS — Z23 Encounter for immunization: Secondary | ICD-10-CM | POA: Diagnosis not present

## 2021-02-26 DIAGNOSIS — H9190 Unspecified hearing loss, unspecified ear: Secondary | ICD-10-CM | POA: Diagnosis not present

## 2021-02-26 DIAGNOSIS — R82998 Other abnormal findings in urine: Secondary | ICD-10-CM | POA: Diagnosis not present

## 2021-02-26 DIAGNOSIS — R918 Other nonspecific abnormal finding of lung field: Secondary | ICD-10-CM | POA: Diagnosis not present

## 2021-02-26 DIAGNOSIS — N401 Enlarged prostate with lower urinary tract symptoms: Secondary | ICD-10-CM | POA: Diagnosis not present

## 2021-02-26 DIAGNOSIS — I517 Cardiomegaly: Secondary | ICD-10-CM | POA: Diagnosis not present

## 2021-02-26 DIAGNOSIS — E785 Hyperlipidemia, unspecified: Secondary | ICD-10-CM | POA: Diagnosis not present

## 2021-02-26 DIAGNOSIS — I2510 Atherosclerotic heart disease of native coronary artery without angina pectoris: Secondary | ICD-10-CM | POA: Diagnosis not present

## 2021-02-26 DIAGNOSIS — Z1331 Encounter for screening for depression: Secondary | ICD-10-CM | POA: Diagnosis not present

## 2021-02-26 DIAGNOSIS — Z0000 Encounter for general adult medical examination without abnormal findings: Secondary | ICD-10-CM | POA: Diagnosis not present

## 2021-03-04 ENCOUNTER — Other Ambulatory Visit: Payer: Self-pay | Admitting: Internal Medicine

## 2021-03-18 ENCOUNTER — Other Ambulatory Visit: Payer: Self-pay | Admitting: Gastroenterology

## 2021-03-30 DIAGNOSIS — Z23 Encounter for immunization: Secondary | ICD-10-CM | POA: Diagnosis not present

## 2021-04-14 ENCOUNTER — Other Ambulatory Visit: Payer: Self-pay | Admitting: Gastroenterology

## 2021-04-14 ENCOUNTER — Other Ambulatory Visit: Payer: Self-pay

## 2021-04-14 MED ORDER — ESOMEPRAZOLE MAGNESIUM 40 MG PO CPDR: DELAYED_RELEASE_CAPSULE | ORAL | 0 refills | Status: DC

## 2021-05-20 ENCOUNTER — Other Ambulatory Visit: Payer: Self-pay | Admitting: Gastroenterology

## 2021-05-20 ENCOUNTER — Telehealth: Payer: Self-pay | Admitting: Gastroenterology

## 2021-05-20 MED ORDER — ESOMEPRAZOLE MAGNESIUM 20 MG PO CPDR: 20.0000 mg | DELAYED_RELEASE_CAPSULE | Freq: Every day | ORAL | 0 refills | Status: DC

## 2021-05-20 NOTE — Telephone Encounter (Signed)
Patient states he would like to try a lower dose of Nexium since he has has diarrhea off and on with the 40 mg dose. Patient scheduled appt on 06/16/21. Informed patient I will send in Nexium 20 mg daily to his pharmacy to try until his scheduled appt. Patient verbalized understanding.

## 2021-05-20 NOTE — Telephone Encounter (Signed)
Patient called to make an appointment for a refill on Nexium. Patient was scheduled on 1/24 and stated he is completely out and wanted to see if he could get some to hold him over until the appointment time.    Also, seeking advise if there is anyway he could get proscription 20 Mg instead of 40 Mg . Please advise.

## 2021-06-16 ENCOUNTER — Encounter: Payer: Self-pay | Admitting: Gastroenterology

## 2021-06-16 ENCOUNTER — Ambulatory Visit (INDEPENDENT_AMBULATORY_CARE_PROVIDER_SITE_OTHER): Payer: Medicare Other | Admitting: Gastroenterology

## 2021-06-16 VITALS — BP 152/88 | HR 80 | Ht 67.0 in | Wt 202.5 lb

## 2021-06-16 DIAGNOSIS — Z8711 Personal history of peptic ulcer disease: Secondary | ICD-10-CM | POA: Diagnosis not present

## 2021-06-16 DIAGNOSIS — K219 Gastro-esophageal reflux disease without esophagitis: Secondary | ICD-10-CM | POA: Diagnosis not present

## 2021-06-16 MED ORDER — PANTOPRAZOLE SODIUM 20 MG PO TBEC: 20.0000 mg | DELAYED_RELEASE_TABLET | Freq: Every day | ORAL | 11 refills | Status: DC

## 2021-06-16 NOTE — Patient Instructions (Signed)
We have sent the following medications to your pharmacy for you to pick up at your convenience: pantoprazole 20 mg daily.   Call our office back if the lower dose does not help your reflux symptoms.   Due to recent changes in healthcare laws, you may see the results of your imaging and laboratory studies on MyChart before your provider has had a chance to review them.  We understand that in some cases there may be results that are confusing or concerning to you. Not all laboratory results come back in the same time frame and the provider may be waiting for multiple results in order to interpret others.  Please give [place] 48 hours in order for your provider to thoroughly review all the results before contacting the office for clarification of your results.   The [facility] providers would like to encourage you to use [facility] to communicate with providers for non-urgent requests or questions.  Due to long hold times on the telephone, sending your provider a message by [facility] may be a faster and more efficient way to get a response.  Please allow 48 business hours for a response.  Please remember that this is for non-urgent requests.   Thank you for choosing me and [facility].  Pricilla Riffle. Dagoberto Ligas., MD., Marval Regal

## 2021-06-16 NOTE — Progress Notes (Signed)
° ° °  History of Present Illness: This is a 71 year old male returning for follow-up for GERD and a history of a bleeding gastric ulcer.  He has been maintained on Nexium 20 mg daily.  Prior to that he was treated with pantoprazole 40 mg daily.  He states both medications cause mild diarrhea.  He occasionally has minimal, transient epigastric discomfort.  No other gastrointestinal complaints.  Current Medications, Allergies, Past Medical History, Past Surgical History, Family History and Social History were reviewed in [employer] record.   Physical Exam: General: Well developed, well nourished, no acute distress Head: Normocephalic and atraumatic Eyes: Sclerae anicteric, EOMI Ears: Normal auditory acuity Mouth: Not examined, mask on during Covid-19 pandemic Lungs: Clear throughout to auscultation Heart: Regular rate and rhythm; no murmurs, rubs or bruits Abdomen: Soft, non tender and non distended. No masses, hepatosplenomegaly or hernias noted. Normal Bowel sounds Rectal: Not done  Musculoskeletal: Symmetrical with no gross deformities  Pulses:  Normal pulses noted Extremities: No clubbing, cyanosis, edema or deformities noted Neurological: Alert oriented x 4, grossly nonfocal Psychological:  Alert and cooperative. Normal mood and affect   Assessment and Recommendations:  History of a gastric ulcer with bleed in 2018. GERD.  Follow antireflux measures.  Minimize or avoid NSAIDs.  Trial of pantoprazole 20 mg p.o. daily.  If he is still experiencing loose stools or diarrhea he is advised to call and we can consider a different PPI or famotidine. REV in 1 year. CRC screening, average risk.  10-year interval colonoscopy is recommended in August 2031.

## 2021-08-12 DIAGNOSIS — J302 Other seasonal allergic rhinitis: Secondary | ICD-10-CM | POA: Diagnosis not present

## 2021-08-12 DIAGNOSIS — R0981 Nasal congestion: Secondary | ICD-10-CM | POA: Diagnosis not present

## 2021-09-04 ENCOUNTER — Other Ambulatory Visit: Payer: Self-pay | Admitting: Internal Medicine

## 2021-09-04 DIAGNOSIS — N1831 Chronic kidney disease, stage 3a: Secondary | ICD-10-CM | POA: Diagnosis not present

## 2021-09-04 DIAGNOSIS — N401 Enlarged prostate with lower urinary tract symptoms: Secondary | ICD-10-CM | POA: Diagnosis not present

## 2021-09-04 DIAGNOSIS — R911 Solitary pulmonary nodule: Secondary | ICD-10-CM

## 2021-09-04 DIAGNOSIS — E785 Hyperlipidemia, unspecified: Secondary | ICD-10-CM | POA: Diagnosis not present

## 2021-09-04 DIAGNOSIS — Z23 Encounter for immunization: Secondary | ICD-10-CM | POA: Diagnosis not present

## 2021-09-04 DIAGNOSIS — I2510 Atherosclerotic heart disease of native coronary artery without angina pectoris: Secondary | ICD-10-CM | POA: Diagnosis not present

## 2021-09-04 DIAGNOSIS — R7301 Impaired fasting glucose: Secondary | ICD-10-CM | POA: Diagnosis not present

## 2021-09-04 DIAGNOSIS — I129 Hypertensive chronic kidney disease with stage 1 through stage 4 chronic kidney disease, or unspecified chronic kidney disease: Secondary | ICD-10-CM | POA: Diagnosis not present

## 2021-09-04 DIAGNOSIS — I10 Essential (primary) hypertension: Secondary | ICD-10-CM | POA: Diagnosis not present

## 2021-09-04 DIAGNOSIS — K219 Gastro-esophageal reflux disease without esophagitis: Secondary | ICD-10-CM | POA: Diagnosis not present

## 2021-09-10 DIAGNOSIS — M7989 Other specified soft tissue disorders: Secondary | ICD-10-CM | POA: Diagnosis not present

## 2021-09-10 DIAGNOSIS — M65331 Trigger finger, right middle finger: Secondary | ICD-10-CM | POA: Diagnosis not present

## 2021-09-10 DIAGNOSIS — M65341 Trigger finger, right ring finger: Secondary | ICD-10-CM | POA: Diagnosis not present

## 2021-10-05 ENCOUNTER — Ambulatory Visit
Admission: RE | Admit: 2021-10-05 | Discharge: 2021-10-05 | Disposition: A | Discharge status: Home / Self Care | Payer: Medicare Other | Source: Ambulatory Visit | Attending: Internal Medicine | Admitting: Internal Medicine

## 2021-10-05 DIAGNOSIS — R911 Solitary pulmonary nodule: Secondary | ICD-10-CM

## 2021-10-05 DIAGNOSIS — R918 Other nonspecific abnormal finding of lung field: Secondary | ICD-10-CM | POA: Diagnosis not present

## 2021-10-05 DIAGNOSIS — J9811 Atelectasis: Secondary | ICD-10-CM | POA: Diagnosis not present

## 2021-10-13 ENCOUNTER — Other Ambulatory Visit: Payer: Self-pay | Admitting: Internal Medicine

## 2021-10-13 DIAGNOSIS — K862 Cyst of pancreas: Secondary | ICD-10-CM

## 2021-10-22 ENCOUNTER — Other Ambulatory Visit: Payer: Medicare Other

## 2021-10-26 ENCOUNTER — Other Ambulatory Visit: Payer: Self-pay

## 2021-10-26 ENCOUNTER — Other Ambulatory Visit: Payer: Medicare Other

## 2021-10-26 DIAGNOSIS — L923 Foreign body granuloma of the skin and subcutaneous tissue: Secondary | ICD-10-CM | POA: Diagnosis not present

## 2021-10-26 DIAGNOSIS — L729 Follicular cyst of the skin and subcutaneous tissue, unspecified: Secondary | ICD-10-CM | POA: Diagnosis not present

## 2021-10-26 DIAGNOSIS — M65341 Trigger finger, right ring finger: Secondary | ICD-10-CM | POA: Diagnosis not present

## 2021-10-26 DIAGNOSIS — M60241 Foreign body granuloma of soft tissue, not elsewhere classified, right hand: Secondary | ICD-10-CM | POA: Diagnosis not present

## 2021-10-26 DIAGNOSIS — M65331 Trigger finger, right middle finger: Secondary | ICD-10-CM | POA: Diagnosis not present

## 2021-10-26 DIAGNOSIS — M795 Residual foreign body in soft tissue: Secondary | ICD-10-CM | POA: Diagnosis not present

## 2021-10-29 ENCOUNTER — Ambulatory Visit
Admission: RE | Admit: 2021-10-29 | Discharge: 2021-10-29 | Disposition: A | Discharge status: Home / Self Care | Payer: Medicare Other | Source: Ambulatory Visit | Attending: Internal Medicine | Admitting: Internal Medicine

## 2021-10-29 DIAGNOSIS — K862 Cyst of pancreas: Secondary | ICD-10-CM

## 2021-10-29 DIAGNOSIS — K760 Fatty (change of) liver, not elsewhere classified: Secondary | ICD-10-CM | POA: Diagnosis not present

## 2021-10-29 MED ORDER — GADOBENATE DIMEGLUMINE 529 MG/ML IV SOLN
19.0000 mL | Freq: Once | INTRAVENOUS | Status: AC | PRN
  Administered 2021-10-29: 19 mL via INTRAVENOUS

## 2022-02-25 DIAGNOSIS — R7301 Impaired fasting glucose: Secondary | ICD-10-CM | POA: Diagnosis not present

## 2022-02-25 DIAGNOSIS — I10 Essential (primary) hypertension: Secondary | ICD-10-CM | POA: Diagnosis not present

## 2022-02-25 DIAGNOSIS — Z125 Encounter for screening for malignant neoplasm of prostate: Secondary | ICD-10-CM | POA: Diagnosis not present

## 2022-02-25 DIAGNOSIS — E785 Hyperlipidemia, unspecified: Secondary | ICD-10-CM | POA: Diagnosis not present

## 2022-02-25 DIAGNOSIS — R7989 Other specified abnormal findings of blood chemistry: Secondary | ICD-10-CM | POA: Diagnosis not present

## 2022-03-05 DIAGNOSIS — E663 Overweight: Secondary | ICD-10-CM | POA: Diagnosis not present

## 2022-03-05 DIAGNOSIS — Z0000 Encounter for general adult medical examination without abnormal findings: Secondary | ICD-10-CM | POA: Diagnosis not present

## 2022-03-05 DIAGNOSIS — Z23 Encounter for immunization: Secondary | ICD-10-CM | POA: Diagnosis not present

## 2022-03-05 DIAGNOSIS — K8689 Other specified diseases of pancreas: Secondary | ICD-10-CM | POA: Diagnosis not present

## 2022-03-05 DIAGNOSIS — N1831 Chronic kidney disease, stage 3a: Secondary | ICD-10-CM | POA: Diagnosis not present

## 2022-03-05 DIAGNOSIS — I129 Hypertensive chronic kidney disease with stage 1 through stage 4 chronic kidney disease, or unspecified chronic kidney disease: Secondary | ICD-10-CM | POA: Diagnosis not present

## 2022-03-05 DIAGNOSIS — R7301 Impaired fasting glucose: Secondary | ICD-10-CM | POA: Diagnosis not present

## 2022-03-05 DIAGNOSIS — I517 Cardiomegaly: Secondary | ICD-10-CM | POA: Diagnosis not present

## 2022-03-05 DIAGNOSIS — I2510 Atherosclerotic heart disease of native coronary artery without angina pectoris: Secondary | ICD-10-CM | POA: Diagnosis not present

## 2022-03-05 DIAGNOSIS — N529 Male erectile dysfunction, unspecified: Secondary | ICD-10-CM | POA: Diagnosis not present

## 2022-03-08 DIAGNOSIS — H5203 Hypermetropia, bilateral: Secondary | ICD-10-CM | POA: Diagnosis not present

## 2022-03-08 DIAGNOSIS — H524 Presbyopia: Secondary | ICD-10-CM | POA: Diagnosis not present

## 2022-03-08 DIAGNOSIS — Z961 Presence of intraocular lens: Secondary | ICD-10-CM | POA: Diagnosis not present

## 2022-03-08 DIAGNOSIS — H52223 Regular astigmatism, bilateral: Secondary | ICD-10-CM | POA: Diagnosis not present

## 2022-03-09 ENCOUNTER — Other Ambulatory Visit: Payer: Self-pay | Admitting: Internal Medicine

## 2022-03-09 DIAGNOSIS — K8689 Other specified diseases of pancreas: Secondary | ICD-10-CM

## 2022-03-15 ENCOUNTER — Ambulatory Visit
Admission: RE | Admit: 2022-03-15 | Discharge: 2022-03-15 | Disposition: A | Discharge status: Home / Self Care | Payer: Medicare Other | Source: Ambulatory Visit | Attending: Internal Medicine | Admitting: Internal Medicine

## 2022-03-15 DIAGNOSIS — K8689 Other specified diseases of pancreas: Secondary | ICD-10-CM

## 2022-03-15 DIAGNOSIS — K7689 Other specified diseases of liver: Secondary | ICD-10-CM | POA: Diagnosis not present

## 2022-03-15 MED ORDER — GADOPICLENOL 0.5 MMOL/ML IV SOLN
10.0000 mL | Freq: Once | INTRAVENOUS | Status: AC | PRN
  Administered 2022-03-15: 10 mL via INTRAVENOUS

## 2022-03-22 ENCOUNTER — Other Ambulatory Visit: Payer: Self-pay | Admitting: Internal Medicine

## 2022-03-22 DIAGNOSIS — K862 Cyst of pancreas: Secondary | ICD-10-CM

## 2022-04-12 DIAGNOSIS — R82998 Other abnormal findings in urine: Secondary | ICD-10-CM | POA: Diagnosis not present

## 2022-06-11 ENCOUNTER — Other Ambulatory Visit: Payer: Self-pay | Admitting: Gastroenterology

## 2022-07-19 ENCOUNTER — Other Ambulatory Visit: Payer: Self-pay | Admitting: Gastroenterology

## 2022-07-20 ENCOUNTER — Other Ambulatory Visit: Payer: Self-pay | Admitting: Gastroenterology

## 2022-07-20 ENCOUNTER — Telehealth: Payer: Self-pay | Admitting: Gastroenterology

## 2022-07-20 MED ORDER — PANTOPRAZOLE SODIUM 20 MG PO TBEC: DELAYED_RELEASE_TABLET | ORAL | 1 refills | Status: DC

## 2022-07-20 NOTE — Telephone Encounter (Signed)
Patient is calling asking for a refill on his Pantoprazole Rx. Please advise

## 2022-07-20 NOTE — Telephone Encounter (Signed)
Informed patient he is due for follow up visit with Dr. Fuller Plan. Patient scheduled appt on 09/13/22 at 8:30am. Prescription sent to patient's pharmacy until scheduled appt.

## 2022-09-09 ENCOUNTER — Ambulatory Visit
Admission: RE | Admit: 2022-09-09 | Discharge: 2022-09-09 | Disposition: A | Discharge status: Home / Self Care | Payer: Medicare Other | Source: Ambulatory Visit | Attending: Internal Medicine | Admitting: Internal Medicine

## 2022-09-09 DIAGNOSIS — K862 Cyst of pancreas: Secondary | ICD-10-CM

## 2022-09-09 MED ORDER — GADOPICLENOL 0.5 MMOL/ML IV SOLN
9.0000 mL | Freq: Once | INTRAVENOUS | Status: AC | PRN
  Administered 2022-09-09: 9 mL via INTRAVENOUS

## 2022-09-13 ENCOUNTER — Encounter: Payer: Self-pay | Admitting: Gastroenterology

## 2022-09-13 ENCOUNTER — Ambulatory Visit (INDEPENDENT_AMBULATORY_CARE_PROVIDER_SITE_OTHER): Payer: Medicare Other | Admitting: Gastroenterology

## 2022-09-13 VITALS — BP 138/84 | HR 78 | Ht 67.0 in | Wt 199.0 lb

## 2022-09-13 DIAGNOSIS — K219 Gastro-esophageal reflux disease without esophagitis: Secondary | ICD-10-CM | POA: Diagnosis not present

## 2022-09-13 DIAGNOSIS — K862 Cyst of pancreas: Secondary | ICD-10-CM

## 2022-09-13 MED ORDER — PANTOPRAZOLE SODIUM 20 MG PO TBEC: DELAYED_RELEASE_TABLET | ORAL | 3 refills | Status: DC

## 2022-09-13 NOTE — Patient Instructions (Signed)
We have sent the following medications to your pharmacy for you to pick up at your convenience: pantoprazole.   The [facility] providers would like to encourage you to use MYCHART to communicate with providers for non-urgent requests or questions.  Due to long hold times on the telephone, sending your provider a message by MYCHART may be a faster and more efficient way to get a response.  Please allow 48 business hours for a response.  Please remember that this is for non-urgent requests.   Thank you for choosing me and [facility].  Malcolm T. Stark, Jr., MD., FACG   

## 2022-09-13 NOTE — Progress Notes (Addendum)
    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 H pylori negative. His reflux symptoms are under excellent control on daily pantoprazole.  He has no gastrointestinal complaints.  Follow-up MRI for pancreatic cystic lesion as below.  It has been stable since 2018 and is consistent with a side branch IPMN. No GI complaints. He feels well.    Labs / Imaging    MRI abd 4/20/20204 1. Unchanged lobulated pancreatic cystic lesion of the superior pancreatic neck measuring 1.7 x 1.2 cm. No solid component or suspicious contrast enhancement. No pancreatic ductal dilatation or surrounding inflammatory changes. This is consistent with a side branch IPMN. This is at most minimally enlarged over a long period of time dating back to 03/20/2017. As there is no observed increased risk of malignancy for such lesions smaller than 2 cm, particularly given well established stability, no further follow-up or characterization is required.   2.  Status post cholecystectomy  Current Medications, Allergies, Past Medical History, Past Surgical History, Family History and Social History were reviewed in [employer] record.   Physical Exam: General: Well developed, well nourished, no acute distress Head: Normocephalic and atraumatic Eyes: Sclerae anicteric, EOMI Ears: Normal auditory acuity Mouth: No deformities or lesions noted Lungs: Clear throughout to auscultation Heart: Regular rate and rhythm; No murmurs, rubs or bruits Abdomen: Soft, non tender and non distended. No masses, hepatosplenomegaly or hernias noted.  Normal Bowel sounds Rectal: Not done Musculoskeletal: Symmetrical with no gross deformities  Pulses:  Normal pulses noted Extremities: No edema or deformities noted Neurological: Alert oriented x 4, grossly nonfocal Psychological:  Alert and cooperative. Normal mood and affect   Sheryle Vice T. Russella Dar, MD 09/13/2022, 8:29 AM

## 2022-09-14 ENCOUNTER — Other Ambulatory Visit: Payer: Medicare Other

## 2022-10-24 ENCOUNTER — Other Ambulatory Visit: Payer: Self-pay | Admitting: Gastroenterology

## 2023-04-25 DIAGNOSIS — Z1212 Encounter for screening for malignant neoplasm of rectum: Secondary | ICD-10-CM | POA: Diagnosis not present

## 2023-04-25 DIAGNOSIS — N1831 Chronic kidney disease, stage 3a: Secondary | ICD-10-CM | POA: Diagnosis not present

## 2023-04-25 DIAGNOSIS — E785 Hyperlipidemia, unspecified: Secondary | ICD-10-CM | POA: Diagnosis not present

## 2023-04-25 DIAGNOSIS — I2510 Atherosclerotic heart disease of native coronary artery without angina pectoris: Secondary | ICD-10-CM | POA: Diagnosis not present

## 2023-04-25 DIAGNOSIS — N401 Enlarged prostate with lower urinary tract symptoms: Secondary | ICD-10-CM | POA: Diagnosis not present

## 2023-04-25 DIAGNOSIS — R7301 Impaired fasting glucose: Secondary | ICD-10-CM | POA: Diagnosis not present

## 2023-04-25 DIAGNOSIS — I129 Hypertensive chronic kidney disease with stage 1 through stage 4 chronic kidney disease, or unspecified chronic kidney disease: Secondary | ICD-10-CM | POA: Diagnosis not present

## 2023-04-26 DIAGNOSIS — Z961 Presence of intraocular lens: Secondary | ICD-10-CM | POA: Diagnosis not present

## 2023-04-26 DIAGNOSIS — I10 Essential (primary) hypertension: Secondary | ICD-10-CM | POA: Diagnosis not present

## 2023-05-02 DIAGNOSIS — Z0100 Encounter for examination of eyes and vision without abnormal findings: Secondary | ICD-10-CM | POA: Diagnosis not present

## 2023-05-03 DIAGNOSIS — N529 Male erectile dysfunction, unspecified: Secondary | ICD-10-CM | POA: Diagnosis not present

## 2023-05-03 DIAGNOSIS — G5600 Carpal tunnel syndrome, unspecified upper limb: Secondary | ICD-10-CM | POA: Diagnosis not present

## 2023-05-03 DIAGNOSIS — N1831 Chronic kidney disease, stage 3a: Secondary | ICD-10-CM | POA: Diagnosis not present

## 2023-05-03 DIAGNOSIS — Z1331 Encounter for screening for depression: Secondary | ICD-10-CM | POA: Diagnosis not present

## 2023-05-03 DIAGNOSIS — H9190 Unspecified hearing loss, unspecified ear: Secondary | ICD-10-CM | POA: Diagnosis not present

## 2023-05-03 DIAGNOSIS — Z0000 Encounter for general adult medical examination without abnormal findings: Secondary | ICD-10-CM | POA: Diagnosis not present

## 2023-05-03 DIAGNOSIS — E785 Hyperlipidemia, unspecified: Secondary | ICD-10-CM | POA: Diagnosis not present

## 2023-05-03 DIAGNOSIS — I1310 Hypertensive heart and chronic kidney disease without heart failure, with stage 1 through stage 4 chronic kidney disease, or unspecified chronic kidney disease: Secondary | ICD-10-CM | POA: Diagnosis not present

## 2023-07-06 ENCOUNTER — Other Ambulatory Visit: Payer: Self-pay | Admitting: Gastroenterology

## 2023-07-06 NOTE — Telephone Encounter (Signed)
Inbound call from [facility] requesting a refill for pantoprazole. Stated patient recently switched over to their pharmacy. Please advise, thank you.

## 2023-07-06 NOTE — Telephone Encounter (Signed)
Please advise if OK to refill as you are DOD pm. This is a former patient of Dr Russella Dar.  Thank you

## 2023-07-07 MED ORDER — PANTOPRAZOLE SODIUM 20 MG PO TBEC: DELAYED_RELEASE_TABLET | ORAL | 0 refills | Status: DC

## 2023-07-07 NOTE — Telephone Encounter (Signed)
Prescription sent to [facility] pharmacy.

## 2023-07-08 ENCOUNTER — Telehealth: Payer: Self-pay | Admitting: Gastroenterology

## 2023-07-08 MED ORDER — PANTOPRAZOLE SODIUM 20 MG PO TBEC: DELAYED_RELEASE_TABLET | ORAL | 0 refills | Status: DC

## 2023-07-08 NOTE — Telephone Encounter (Signed)
Prescription sent to [facility] pharmacy per patient's request.

## 2023-07-08 NOTE — Telephone Encounter (Signed)
Inbound call from patient requesting a refill for pantoprazole medication. Please advise, thank you.

## 2023-08-15 ENCOUNTER — Encounter: Payer: Self-pay | Admitting: Gastroenterology

## 2023-08-15 ENCOUNTER — Ambulatory Visit: Payer: Medicare Other | Admitting: Gastroenterology

## 2023-08-15 ENCOUNTER — Other Ambulatory Visit (INDEPENDENT_AMBULATORY_CARE_PROVIDER_SITE_OTHER)

## 2023-08-15 VITALS — BP 138/90 | HR 87 | Ht 67.0 in | Wt 200.0 lb

## 2023-08-15 DIAGNOSIS — K259 Gastric ulcer, unspecified as acute or chronic, without hemorrhage or perforation: Secondary | ICD-10-CM | POA: Diagnosis not present

## 2023-08-15 DIAGNOSIS — K219 Gastro-esophageal reflux disease without esophagitis: Secondary | ICD-10-CM

## 2023-08-15 LAB — VITAMIN D 25 HYDROXY (VIT D DEFICIENCY, FRACTURES): VITD: 65.47 ng/mL (ref 30.00–100.00)

## 2023-08-15 LAB — CBC WITH DIFFERENTIAL/PLATELET
Basophils Absolute: 0 10*3/uL (ref 0.0–0.1)
Basophils Relative: 0.4 % (ref 0.0–3.0)
Eosinophils Absolute: 0.1 10*3/uL (ref 0.0–0.7)
Eosinophils Relative: 2 % (ref 0.0–5.0)
HCT: 48.5 % (ref 39.0–52.0)
Hemoglobin: 16.6 g/dL (ref 13.0–17.0)
Lymphocytes Relative: 16.6 % (ref 12.0–46.0)
Lymphs Abs: 1.2 10*3/uL (ref 0.7–4.0)
MCHC: 34.1 g/dL (ref 30.0–36.0)
MCV: 98.3 fl (ref 78.0–100.0)
Monocytes Absolute: 0.5 10*3/uL (ref 0.1–1.0)
Monocytes Relative: 7.8 % (ref 3.0–12.0)
Neutro Abs: 5.1 10*3/uL (ref 1.4–7.7)
Neutrophils Relative %: 73.2 % (ref 43.0–77.0)
Platelets: 203 10*3/uL (ref 150.0–400.0)
RBC: 4.94 Mil/uL (ref 4.22–5.81)
RDW: 12.5 % (ref 11.5–15.5)
WBC: 7 10*3/uL (ref 4.0–10.5)

## 2023-08-15 LAB — COMPREHENSIVE METABOLIC PANEL
ALT: 21 U/L (ref 0–53)
AST: 19 U/L (ref 0–37)
Albumin: 4.3 g/dL (ref 3.5–5.2)
Alkaline Phosphatase: 81 U/L (ref 39–117)
BUN: 19 mg/dL (ref 6–23)
CO2: 26 meq/L (ref 19–32)
Calcium: 9.2 mg/dL (ref 8.4–10.5)
Chloride: 102 meq/L (ref 96–112)
Creatinine, Ser: 1.25 mg/dL (ref 0.40–1.50)
GFR: 56.91 mL/min — ABNORMAL LOW (ref >60.00)
Glucose, Bld: 135 mg/dL — ABNORMAL HIGH (ref 70–99)
Potassium: 4.1 meq/L (ref 3.5–5.1)
Sodium: 138 meq/L (ref 135–145)
Total Bilirubin: 0.7 mg/dL (ref 0.2–1.2)
Total Protein: 6.5 g/dL (ref 6.0–8.3)

## 2023-08-15 LAB — MAGNESIUM: Magnesium: 2 mg/dL (ref 1.5–2.5)

## 2023-08-15 MED ORDER — PANTOPRAZOLE SODIUM 20 MG PO TBEC: DELAYED_RELEASE_TABLET | ORAL | 4 refills | Status: AC

## 2023-08-15 NOTE — Progress Notes (Signed)
 Noted.

## 2023-08-15 NOTE — Patient Instructions (Signed)
 We have sent the following medications to your pharmacy for you to pick up at your convenience: Pantoprazole 20mg   Your provider has requested that you go to the basement level for lab work before leaving today. Press "B" on the elevator. The lab is located at the first door on the left as you exit the elevator.  Follow up in 1 year, office will contact to schedule. _______________________________________________________  If your blood pressure at your visit was 140/90 or greater, please contact your primary care physician to follow up on this.  _______________________________________________________  If you are age 11 or older, your body mass index should be between 23-30. Your Body mass index is 31.32 kg/m. If this is out of the aforementioned range listed, please consider follow up with your Primary Care Provider.  If you are age 40 or younger, your body mass index should be between 19-25. Your Body mass index is 31.32 kg/m. If this is out of the aformentioned range listed, please consider follow up with your Primary Care Provider.   ________________________________________________________  The [facility] providers would like to encourage you to use [facility] to communicate with providers for non-urgent requests or questions.  Due to long hold times on the telephone, sending your provider a message by [facility] may be a faster and more efficient way to get a response.  Please allow 48 business hours for a response.  Please remember that this is for non-urgent requests.  _______________________________________________________

## 2023-08-15 NOTE — Progress Notes (Signed)
 Chief Complaint: Med refill Primary GI MD: Dr. Russella Dar  HPI: 73 year old with medical history as listed below presents for medication refill  Last seen 2024 by Dr. Russella Dar.  He has history of gastric ulcer 02/2017 with visible vessel.  Has been maintained on pantoprazole 40 Mg once daily since.  Also has a history of a pancreatic cyst which was followed up with MRI in April 2024 which showed stability since 2018 and no further follow-up required.  Today he states he is doing well and denies GI issues.  Denies change in bowel habits, weight loss, rectal bleeding, breakthrough GERD.  States he is well-maintained on pantoprazole 40 Mg once daily.    PREVIOUS GI WORKUP   MRI abd 4/20/20204 1. Unchanged lobulated pancreatic cystic lesion of the superior pancreatic neck measuring 1.7 x 1.2 cm. No solid component or suspicious contrast enhancement. No pancreatic ductal dilatation or surrounding inflammatory changes. This is consistent with a side branch IPMN. This is at most minimally enlarged over a long period of time dating back to 03/20/2017. As there is no observed increased risk of malignancy for such lesions smaller than 2 cm, particularly given well established stability, no further follow-up or characterization is required.   2.  Status post cholecystectomy  Colonoscopy 12/2019 - One 6 mm polyp in the transverse colon, removed with a cold snare. Resected and retrieved. - Mild diverticulosis in the left colon. - The examination was otherwise normal on direct and retroflexion views.  EGD 05/2017 - Normal esophagus.  - Normal stomach. - Normal duodenal bulb and second portion of the duodenum. - No specimens collected.  EGD 02/2017 - Normal esophagus. - Non- bleeding gastric ulcer with a visible vessel. - Normal examined duodenum.  Past Medical History:  Diagnosis Date   Allergy    mild hayfever    Cataract    removed both with lens implants    Gastric ulcer    GERD  (gastroesophageal reflux disease)    Hyperlipidemia     Past Surgical History:  Procedure Laterality Date   CHOLECYSTECTOMY     COLONOSCOPY     ESOPHAGOGASTRODUODENOSCOPY N/A 03/22/2017   Procedure: ESOPHAGOGASTRODUODENOSCOPY (EGD);  Surgeon: Sherrilyn Rist, MD;  Location: [facility] ENDOSCOPY;  Service: Gastroenterology;  Laterality: N/A;   HERNIA REPAIR     x 2   TONSILLECTOMY     UPPER GASTROINTESTINAL ENDOSCOPY      Current Outpatient Medications  Medication Sig Dispense Refill   cholecalciferol (VITAMIN D3) 25 MCG (1000 UNIT) tablet Take 1,000 Units by mouth daily.     losartan (COZAAR) 25 MG tablet Take 100 mg by mouth daily.     Multiple Vitamin (MULTIVITAMIN) tablet Take 1 tablet by mouth daily.     pantoprazole (PROTONIX) 20 MG tablet TAKE 1 TABLET(20 MG) BY MOUTH DAILY 90 tablet 0   REPATHA SURECLICK 140 MG/ML SOAJ Inject 1 Syringe into the skin every 14 (fourteen) days.     No current facility-administered medications for this visit.    Allergies as of 08/15/2023 - Review Complete 08/15/2023  Allergen Reaction Noted   Nsaids  04/05/2017   Statins  07/30/2013    Family History  Problem Relation Age of Onset   Bladder Cancer Mother    AAA (abdominal aortic aneurysm) Father    Diabetes Mellitus II Brother    Esophageal cancer Paternal Uncle    Ovarian cancer Niece    Colon cancer Neg Hx    Colon polyps Neg Hx  Rectal cancer Neg Hx    Stomach cancer Neg Hx     Social History   Socioeconomic History   Marital status: Married    Spouse name: Not on file   Number of children: Not on file   Years of education: Not on file   Highest education level: Not on file  Occupational History   Not on file  Tobacco Use   Smoking status: Never   Smokeless tobacco: Never  Vaping Use   Vaping status: Never Used  Substance and Sexual Activity   Alcohol use: Yes    Comment: occas.   Drug use: No   Sexual activity: Not on file  Other Topics Concern   Not on file   Social History Narrative   Not on file   Social Drivers of Health   Financial Resource Strain: Not on file  Food Insecurity: Not on file  Transportation Needs: Not on file  Physical Activity: Not on file  Stress: Not on file  Social Connections: Not on file  Intimate Partner Violence: Not on file    Review of Systems:    Constitutional: No weight loss, fever, chills, weakness or fatigue HEENT: Eyes: No change in vision               Ears, Nose, Throat:  No change in hearing or congestion Skin: No rash or itching Cardiovascular: No chest pain, chest pressure or palpitations   Respiratory: No SOB or cough Gastrointestinal: See HPI and otherwise negative Genitourinary: No dysuria or change in urinary frequency Neurological: No headache, dizziness or syncope Musculoskeletal: No new muscle or joint pain Hematologic: No bleeding or bruising Psychiatric: No history of depression or anxiety    Physical Exam:  Vital signs: Ht 5\' 7"  (1.702 m)   Wt 200 lb (90.7 kg)   BMI 31.32 kg/m   Constitutional: NAD, Well developed, Well nourished, alert and cooperative Head:  Normocephalic and atraumatic. Eyes:   PEERL, EOMI. No icterus. Conjunctiva pink. Respiratory: Respirations even and unlabored. Lungs clear to auscultation bilaterally.   No wheezes, crackles, or rhonchi.  Cardiovascular:  Regular rate and rhythm. No peripheral edema, cyanosis or pallor.  Rectal:  Not performed.  Msk:  Symmetrical without gross deformities. Without edema, no deformity or joint abnormality.  Neurologic:  Alert and  oriented x4;  grossly normal neurologically.  Skin:   Dry and intact without significant lesions or rashes. Psychiatric: Oriented to person, place and time. Demonstrates good judgement and reason without abnormal affect or behaviors.   RELEVANT LABS AND IMAGING: CBC    Component Value Date/Time   WBC 6.7 03/22/2017 0610   RBC 2.83 (L) 03/22/2017 0610   HGB 9.3 (L) 03/22/2017 0610   HCT  27.5 (L) 03/22/2017 0610   PLT 198 03/22/2017 0610   MCV 97.2 03/22/2017 0610   MCH 32.9 03/22/2017 0610   MCHC 33.8 03/22/2017 0610   RDW 12.3 03/22/2017 0610   LYMPHSABS 2.6 03/20/2017 1012   MONOABS 0.8 03/20/2017 1012   EOSABS 0.0 03/20/2017 1012   BASOSABS 0.0 03/20/2017 1012    CMP     Component Value Date/Time   NA 138 03/22/2017 0610   K 4.0 03/22/2017 0610   CL 105 03/22/2017 0610   CO2 30 03/22/2017 0610   GLUCOSE 99 03/22/2017 0610   BUN 18 03/22/2017 0610   CREATININE 1.19 03/22/2017 0610   CALCIUM 8.1 (L) 03/22/2017 0610   PROT 5.3 (L) 03/20/2017 1012   ALBUMIN 3.3 (L) 03/20/2017 1012  AST 27 03/20/2017 1012   ALT 29 03/20/2017 1012   ALKPHOS 68 03/20/2017 1012   BILITOT 1.0 03/20/2017 1012   GFRNONAA >60 03/22/2017 0610   GFRAA >60 03/22/2017 0610     Assessment/Plan:   GERD History of gastric ulcer Gastric ulcer on EGD 2018 with EGD 2019 showing complete resolution doing well on pantoprazole 40 Mg once daily without breakthrough symptoms.  No issues today.  Has not had labs in well over a year. - Refill pantoprazole 40 Mg once daily for 1 year - CBC, CMP, vitamin D, magnesium - Discussed long-term PPI risks with patient - Follow-up 1 year (or sooner if symptoms develop)  Screening for colon cancer Last colonoscopy 2021 with a recall of 10 years - Due for repeat 2031  Assigned to Dr. Marina Goodell today   This visit required 32 minutes of patient care (this includes precharting, chart review, review of results, face-to-face time used for counseling as well as treatment plan and follow-up. The patient was provided an opportunity to ask questions and all were answered. The patient agreed with the plan and demonstrated an understanding of the instructions.   Boone Master, PA-C [facility] 08/15/2023, 8:28 AM  Cc: Rodrigo Ran, MD

## 2023-11-07 DIAGNOSIS — R7301 Impaired fasting glucose: Secondary | ICD-10-CM | POA: Diagnosis not present

## 2024-01-28 DIAGNOSIS — Z9109 Other allergy status, other than to drugs and biological substances: Secondary | ICD-10-CM | POA: Diagnosis not present

## 2024-01-28 DIAGNOSIS — G459 Transient cerebral ischemic attack, unspecified: Secondary | ICD-10-CM | POA: Diagnosis not present

## 2024-01-28 DIAGNOSIS — Z888 Allergy status to other drugs, medicaments and biological substances status: Secondary | ICD-10-CM | POA: Diagnosis not present

## 2024-01-28 DIAGNOSIS — G453 Amaurosis fugax: Secondary | ICD-10-CM | POA: Diagnosis not present

## 2024-02-01 DIAGNOSIS — H43811 Vitreous degeneration, right eye: Secondary | ICD-10-CM | POA: Diagnosis not present

## 2024-02-02 DIAGNOSIS — G453 Amaurosis fugax: Secondary | ICD-10-CM | POA: Diagnosis not present

## 2024-02-02 DIAGNOSIS — E785 Hyperlipidemia, unspecified: Secondary | ICD-10-CM | POA: Diagnosis not present

## 2024-02-02 DIAGNOSIS — K279 Peptic ulcer, site unspecified, unspecified as acute or chronic, without hemorrhage or perforation: Secondary | ICD-10-CM | POA: Diagnosis not present

## 2024-02-02 DIAGNOSIS — I10 Essential (primary) hypertension: Secondary | ICD-10-CM | POA: Diagnosis not present

## 2024-03-20 DIAGNOSIS — N1831 Chronic kidney disease, stage 3a: Secondary | ICD-10-CM | POA: Diagnosis not present

## 2024-03-20 DIAGNOSIS — I129 Hypertensive chronic kidney disease with stage 1 through stage 4 chronic kidney disease, or unspecified chronic kidney disease: Secondary | ICD-10-CM | POA: Diagnosis not present

## 2024-03-20 DIAGNOSIS — E785 Hyperlipidemia, unspecified: Secondary | ICD-10-CM | POA: Diagnosis not present

## 2024-03-20 DIAGNOSIS — G453 Amaurosis fugax: Secondary | ICD-10-CM | POA: Diagnosis not present
